# Patient Record
Sex: Male | Born: 1960 | Race: Black or African American | Hispanic: No | Marital: Single | Smoking: Current every day smoker
Health system: Southern US, Community
[De-identification: ages and names within clinical notes are randomized; demographics above are authoritative.]

## PROBLEM LIST (undated history)

## (undated) DIAGNOSIS — I2510 Atherosclerotic heart disease of native coronary artery without angina pectoris: Secondary | ICD-10-CM

## (undated) DIAGNOSIS — I10 Essential (primary) hypertension: Secondary | ICD-10-CM

## (undated) DIAGNOSIS — Z789 Other specified health status: Secondary | ICD-10-CM

## (undated) DIAGNOSIS — Z720 Tobacco use: Secondary | ICD-10-CM

## (undated) DIAGNOSIS — E118 Type 2 diabetes mellitus with unspecified complications: Secondary | ICD-10-CM

## (undated) DIAGNOSIS — I255 Ischemic cardiomyopathy: Secondary | ICD-10-CM

## (undated) DIAGNOSIS — F1410 Cocaine abuse, uncomplicated: Secondary | ICD-10-CM

## (undated) DIAGNOSIS — E785 Hyperlipidemia, unspecified: Secondary | ICD-10-CM

---
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
## 2003-05-29 ENCOUNTER — Encounter: Payer: Self-pay | Admitting: Emergency Medicine

## 2003-05-29 ENCOUNTER — Emergency Department (HOSPITAL_COMMUNITY): Admission: EM | Admit: 2003-05-29 | Discharge: 2003-05-30 | Payer: Self-pay | Admitting: Emergency Medicine

## 2003-06-05 ENCOUNTER — Emergency Department (HOSPITAL_COMMUNITY): Admission: EM | Admit: 2003-06-05 | Discharge: 2003-06-05 | Payer: Self-pay | Admitting: Emergency Medicine

## 2007-08-08 ENCOUNTER — Emergency Department (HOSPITAL_COMMUNITY): Admission: EM | Admit: 2007-08-08 | Discharge: 2007-08-08 | Payer: Self-pay | Admitting: Emergency Medicine

## 2007-08-19 ENCOUNTER — Encounter: Admission: RE | Admit: 2007-08-19 | Discharge: 2007-08-19 | Payer: Self-pay | Admitting: Emergency Medicine

## 2009-06-19 IMAGING — CR DG LUMBAR SPINE 2-3V
3 series · 3 of 3 positions shown · non-contrast
Comparison: none

CLINICAL DATA: Back pain.
 LUMBAR SPINE ? TWO VIEWS:
 The lumbar vertebrae are in normal alignment with normal intervertebral disk spaces.  No significant degenerative change is seen and no compression deformity is present.  The SI joints appear normal.

[view not recorded (1 of 3)]
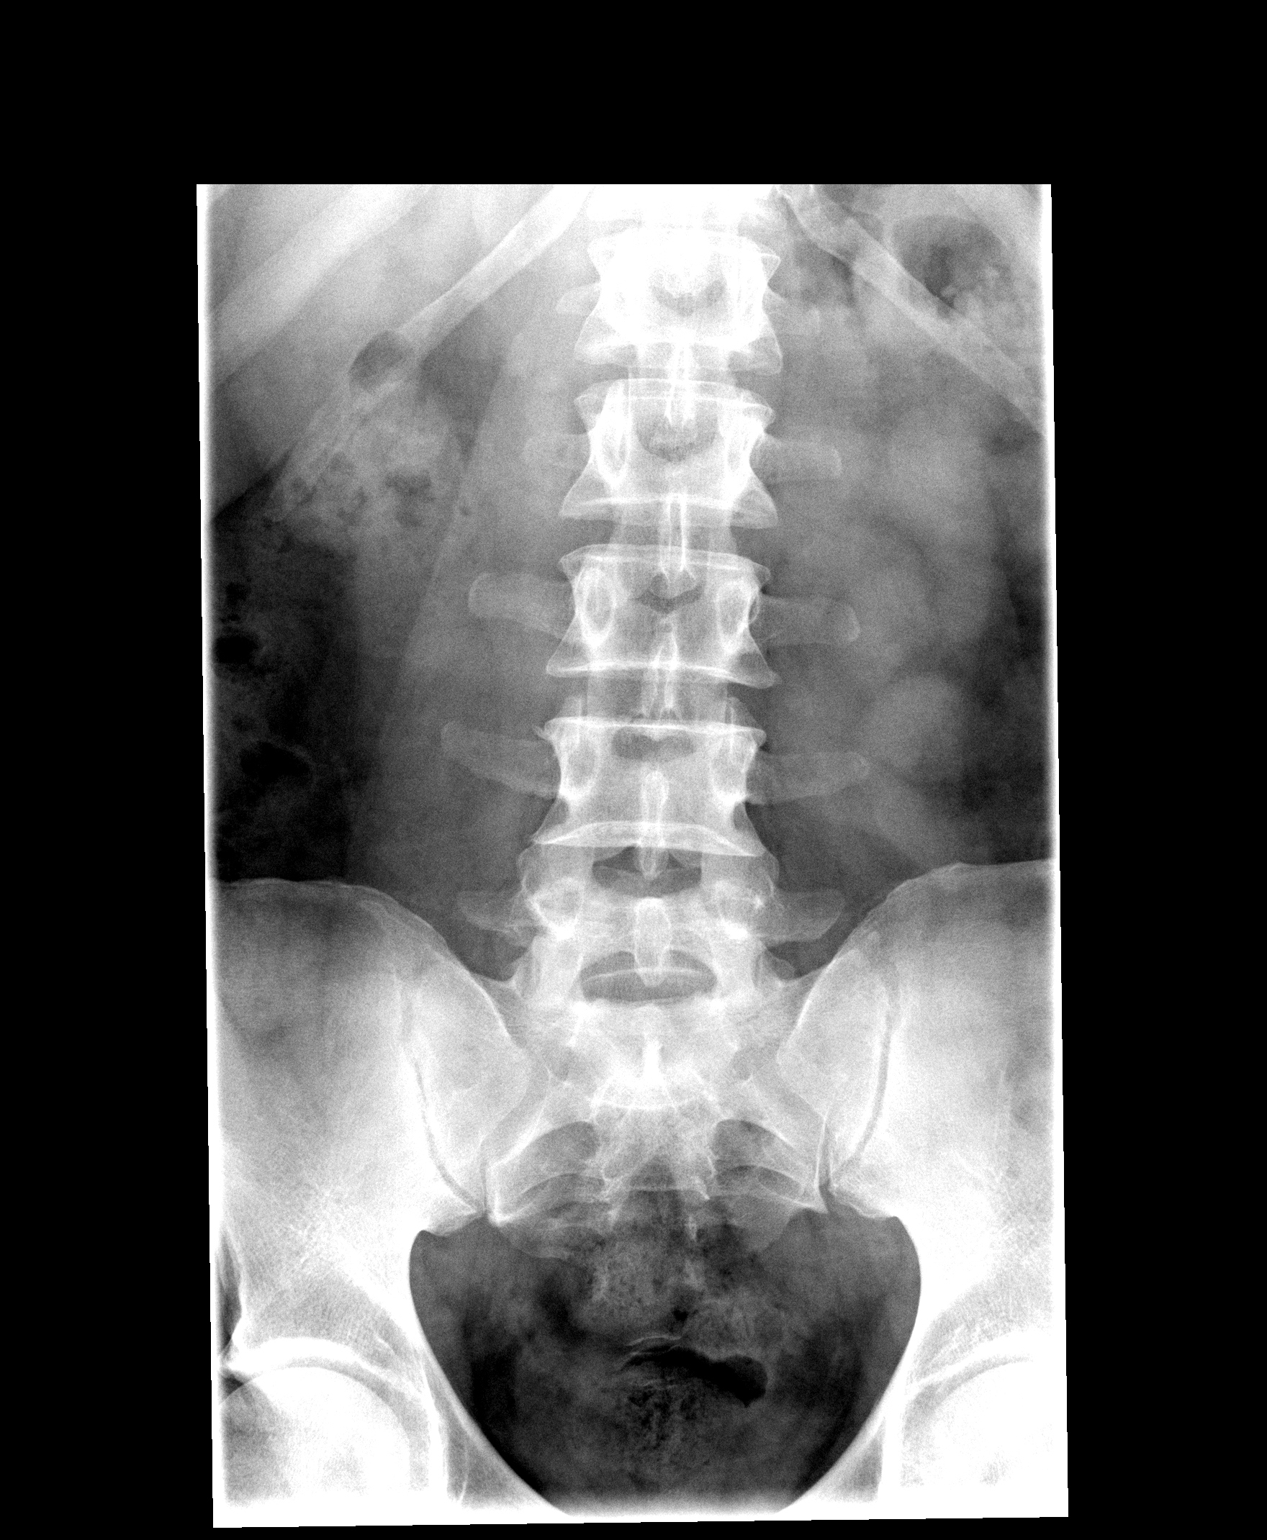

[view not recorded (2 of 3)]
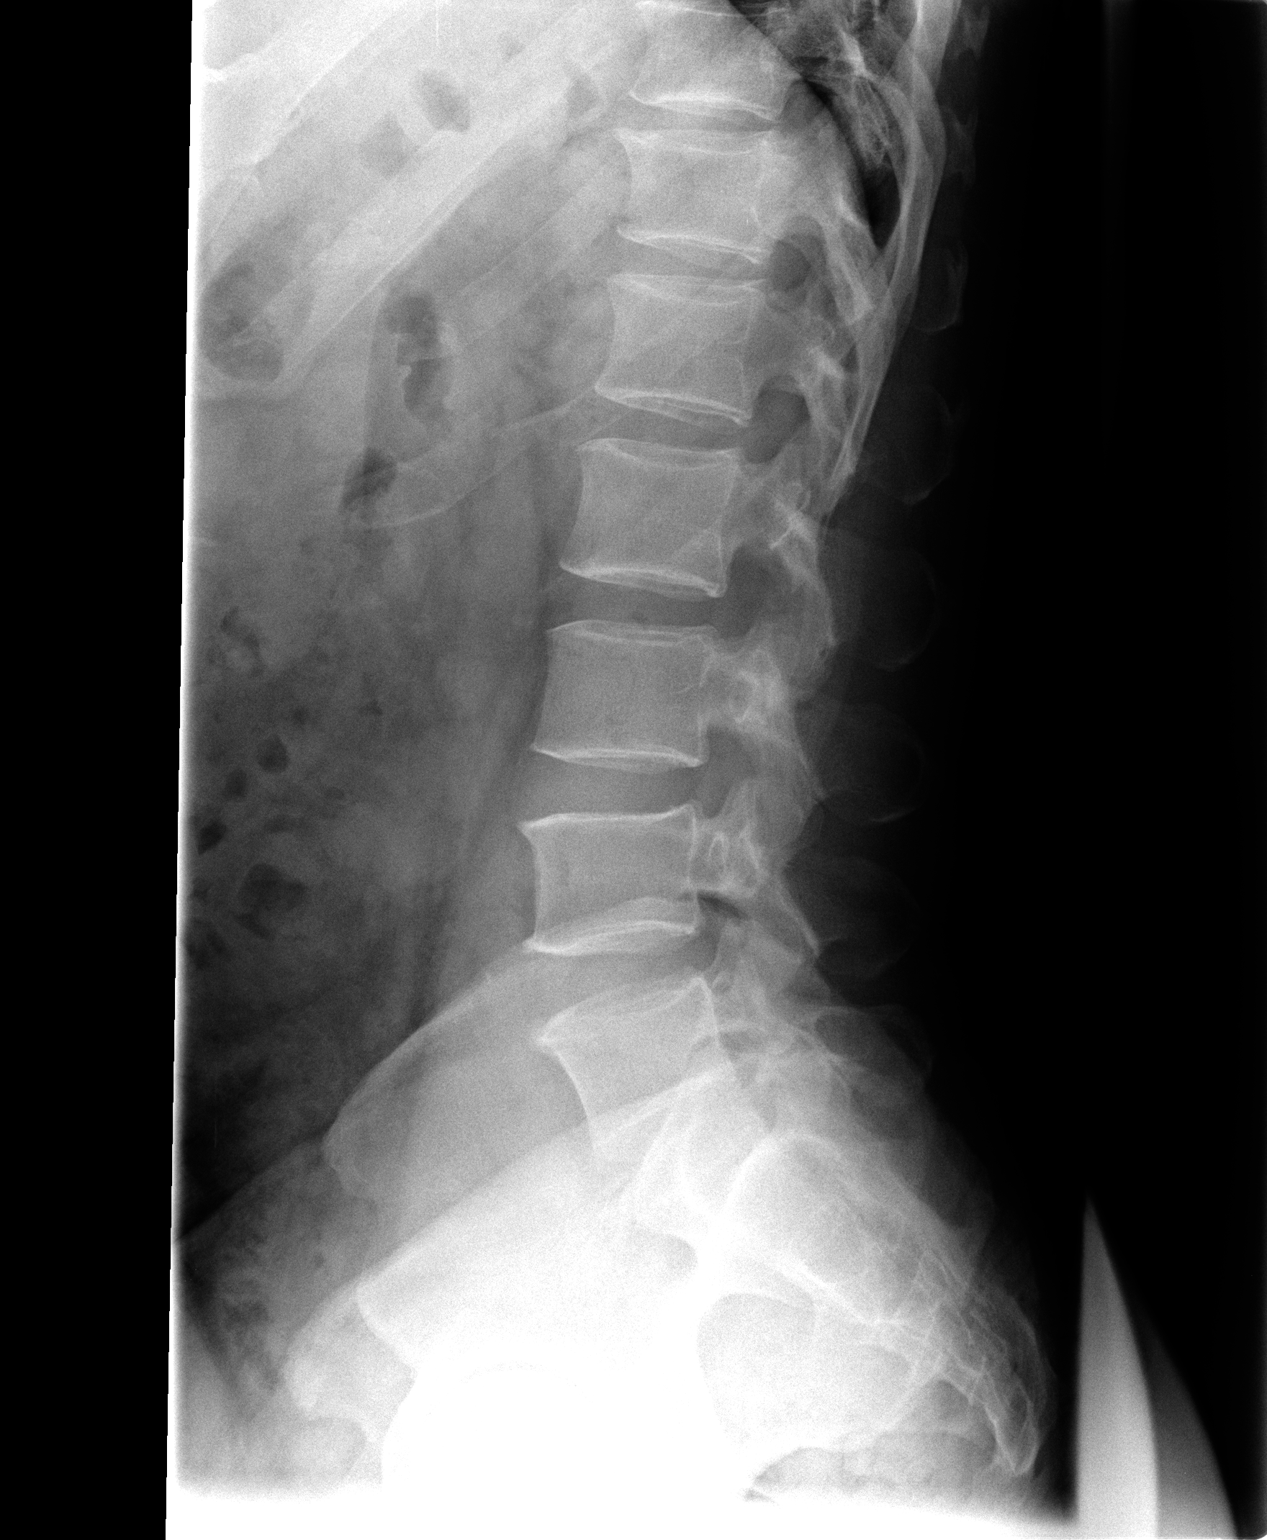

[view not recorded (3 of 3)]
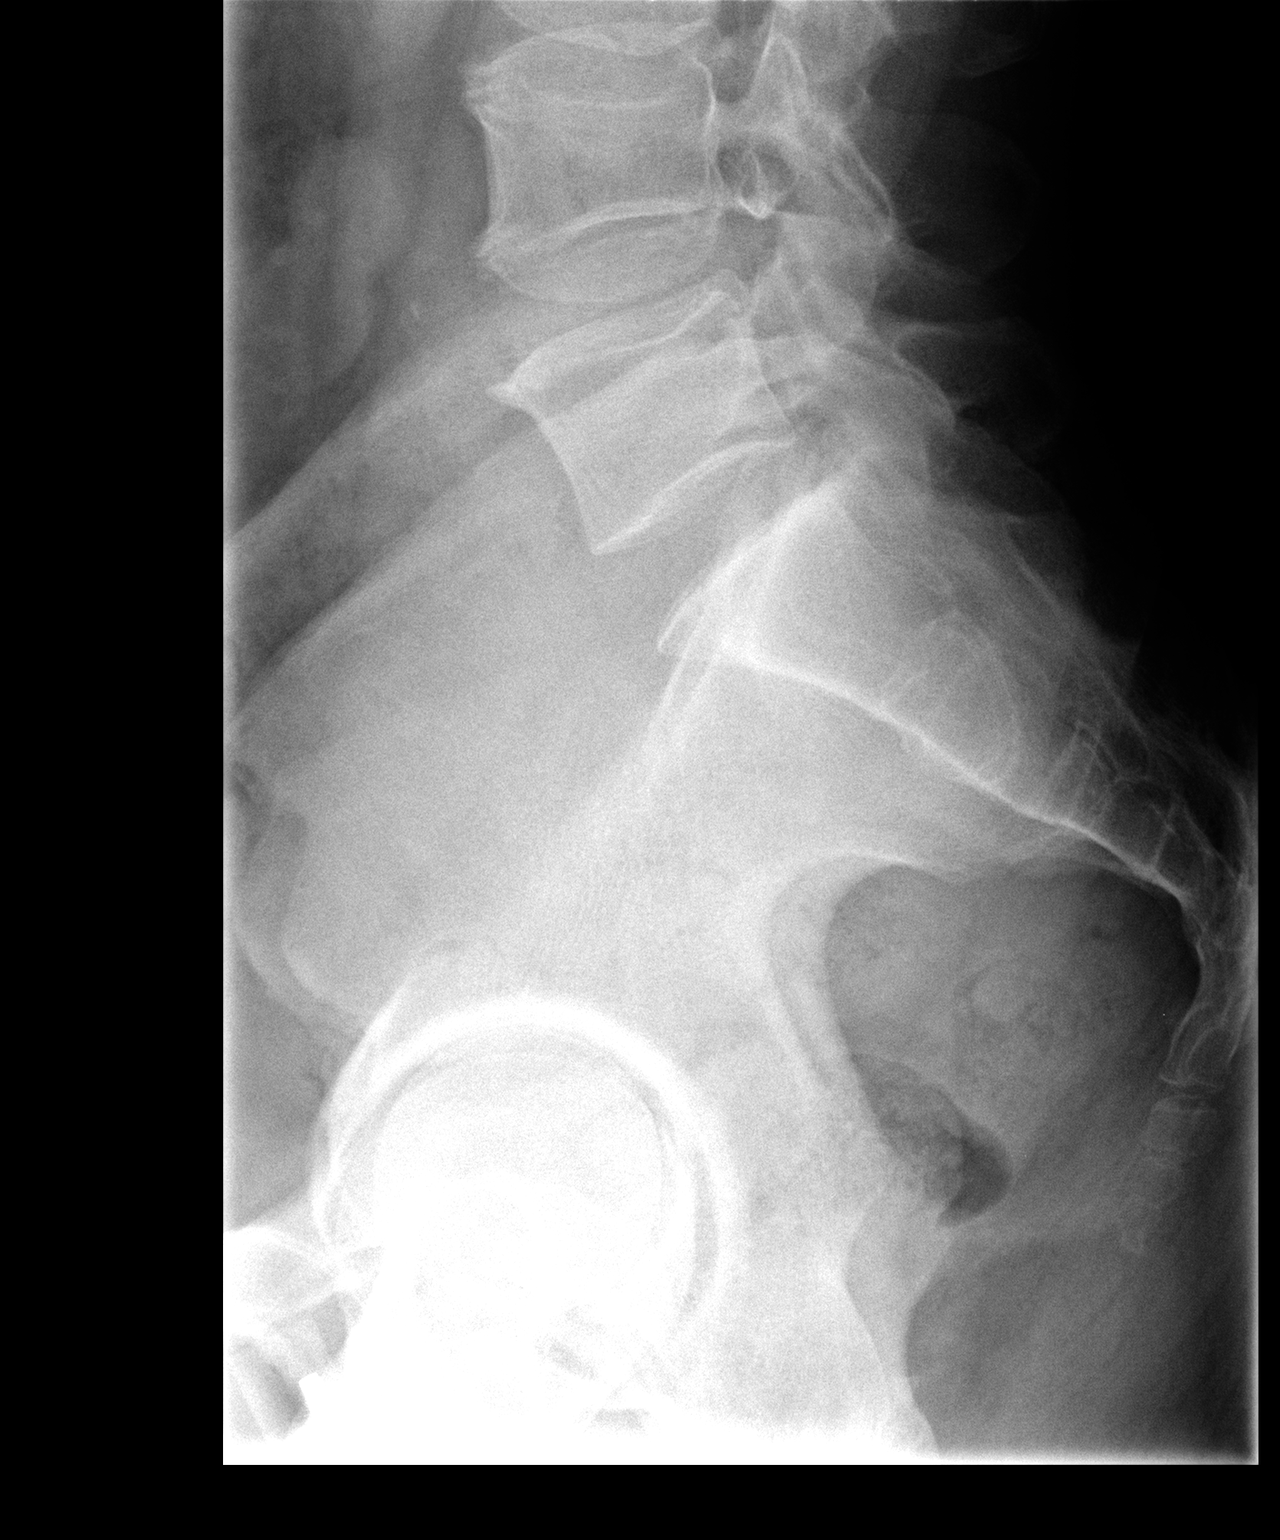

[3 of 3 positions shown; findings below may reference images not displayed]

IMPRESSION: Negative.

## 2011-09-06 LAB — COMPREHENSIVE METABOLIC PANEL
ALT: 22
AST: 20
Albumin: 3.9
Alkaline Phosphatase: 85
BUN: 12
CO2: 25
GFR calc Af Amer: >60
GFR calc non Af Amer: >60
Glucose, Bld: 125 — ABNORMAL HIGH

## 2011-09-06 LAB — URINALYSIS, ROUTINE W REFLEX MICROSCOPIC
Bilirubin Urine: NEGATIVE
Nitrite: NEGATIVE
Specific Gravity, Urine: 1.019
Urobilinogen, UA: 0.2

## 2011-09-06 LAB — CBC
HCT: 45.1
Hemoglobin: 15.9
MCV: 90.2

## 2011-09-06 LAB — DIFFERENTIAL
Basophils Absolute: 0
Lymphocytes Relative: 19
Neutro Abs: 9 — ABNORMAL HIGH
Neutrophils Relative %: 72

## 2011-09-06 LAB — LACTIC ACID, PLASMA: Lactic Acid, Venous: 1.7

## 2011-09-06 LAB — LIPASE, BLOOD: Lipase: 30

## 2013-05-13 ENCOUNTER — Emergency Department (INDEPENDENT_AMBULATORY_CARE_PROVIDER_SITE_OTHER)
Admission: EM | Admit: 2013-05-13 | Discharge: 2013-05-13 | Disposition: A | Discharge status: Home / Self Care | Payer: BC Managed Care – PPO | Source: Home / Self Care | Attending: Emergency Medicine | Admitting: Emergency Medicine

## 2013-05-13 ENCOUNTER — Encounter (HOSPITAL_COMMUNITY): Payer: Self-pay | Admitting: *Deleted

## 2013-05-13 DIAGNOSIS — S335XXA Sprain of ligaments of lumbar spine, initial encounter: Secondary | ICD-10-CM

## 2013-05-13 MED ORDER — IBUPROFEN 800 MG PO TABS
800.0000 mg | ORAL_TABLET | Freq: Once | ORAL | Status: AC
  Administered 2013-05-13: 800 mg via ORAL

## 2013-05-13 MED ORDER — DICLOFENAC SODIUM 50 MG PO TBEC: 50.0000 mg | DELAYED_RELEASE_TABLET | Freq: Two times a day (BID) | ORAL | Status: AC

## 2013-05-13 MED ORDER — CYCLOBENZAPRINE HCL 10 MG PO TABS: 10.0000 mg | ORAL_TABLET | Freq: Two times a day (BID) | ORAL | Status: DC | PRN

## 2013-05-13 MED ORDER — IBUPROFEN 800 MG PO TABS
ORAL_TABLET | ORAL | Status: AC
  Filled 2013-05-13: qty 1

## 2013-05-13 NOTE — ED Notes (Signed)
Woke up yesterday morning with low back pain - was moving tables on the day before. Pain mid low back  Only - now radiation.

## 2013-05-13 NOTE — ED Provider Notes (Addendum)
History     CSN: [id number]  Arrival date & time 05/13/13  1021   First MD Initiated Contact with Patient 05/13/13 1048      Chief Complaint  Patient presents with  . Back Pain    (Consider location/radiation/quality/duration/timing/severity/associated sxs/prior treatment) HPI Comments: Patient presents to urgent care describing that Tuesday he was lifting and moving furniture. The following day woke up with significant pain on his lower back. Movement and activity makes the pain worse. Patient denies any numbness or tingling sensation radiating or affecting his lower extremities or back. He denies any fecal or urinary incontinence or lower extremity weakness. Prior, to Wednesday patient had no pain at all. Patient describes she's been taking Tylenol over-the-counter for pain relief. Patient denies any further symptoms such as fevers, generalized malaise arthralgias myalgias- or abdominal pain  Patient is a 52 y.o. male presenting with back pain. The history is provided by the patient.  Back Pain Location:  Lumbar spine Quality:  Aching Radiates to:  Does not radiate Pain severity:  Moderate Pain is:  Same all the time Onset quality:  Gradual Duration:  3 days Timing:  Constant Chronicity:  New Context: lifting heavy objects   Context: not falling, not jumping from heights, not recent illness and not recent injury   Relieved by:  Bed rest and NSAIDs Worsened by:  Movement and standing Associated symptoms: no abdominal pain, no bladder incontinence, no bowel incontinence, no chest pain, no fever, no leg pain, no numbness, no paresthesias, no perianal numbness, no tingling and no weakness   Risk factors: lack of exercise   Risk factors: no hx of cancer and no recent surgery     History reviewed. No pertinent past medical history.  Past Surgical History  Procedure Laterality Date  . Artery repair Right 1970    laceration right elbow with artery involvement    History  reviewed. No pertinent family history.  History  Substance Use Topics  . Smoking status: Current Every Day Smoker -- 1.00 packs/day for 30 years    Types: Cigarettes  . Smokeless tobacco: Never Used  . Alcohol Use: 3.6 oz/week    6 Cans of beer per week     Comment: 6 pack per day      Review of Systems  Constitutional: Positive for activity change. Negative for fever, chills, diaphoresis, appetite change and fatigue.  HENT: Negative for neck pain.   Respiratory: Negative for shortness of breath.   Cardiovascular: Negative for chest pain.  Gastrointestinal: Negative for abdominal pain and bowel incontinence.  Genitourinary: Negative for bladder incontinence and urgency.  Musculoskeletal: Positive for back pain. Negative for myalgias, joint swelling, arthralgias and gait problem.  Skin: Negative for color change, pallor, rash and wound.  Neurological: Negative for tingling, weakness, numbness and paresthesias.    Allergies  Review of patient's allergies indicates no known allergies.  Home Medications   Current Outpatient Rx  Name  Route  Sig  Dispense  Refill  . cyclobenzaprine (FLEXERIL) 10 MG tablet   Oral   Take 1 tablet (10 mg total) by mouth 2 (two) times daily as needed for muscle spasms.   14 tablet   0   . diclofenac (VOLTAREN) 50 MG EC tablet   Oral   Take 1 tablet (50 mg total) by mouth 2 (two) times daily.   14 tablet   9     BP 134/82  Pulse 77  Temp(Src) 97.4 F (36.3 C) (Oral)  SpO2 93%  Physical Exam  Nursing note and vitals reviewed. Constitutional: He appears well-developed and well-nourished.  Eyes: Conjunctivae are normal.  Abdominal: There is no tenderness.  Musculoskeletal: He exhibits tenderness.       Back:  Neurological: He is alert. He exhibits normal muscle tone. Coordination normal.  Skin: No rash noted. No erythema.    ED Course  Procedures (including critical care time)  Labs Reviewed - No data to display No results  found.   1. Lumbar sprain, initial encounter       MDM  Symptoms and exam worse most consistent with a mid paravertebral lumbar sprain/strain. Patient will be treated with end-stage and a muscle relaxer for the next 5-7 days. Have discussed with patient and symptoms that should warrant further evaluation of his return or if pain persists or no improvement is noted after 7-10 days to followup with the orthopedic Dr. patient agreed with treatment plan and followup care as necessary.        Jimmie Molly, MD 05/13/13 1142  Jimmie Molly, MD 05/13/13 1147

## 2013-06-13 ENCOUNTER — Telehealth (HOSPITAL_COMMUNITY): Payer: Self-pay | Admitting: Emergency Medicine

## 2013-06-13 NOTE — ED Notes (Signed)
Chart reviewed.  Work note was given at time of visit to return on 05/15/2013.  Patient made aware we could reprint original note and place it at front desk for him.  Patient states he did not follow up with Ortho because he did not have co-pay.

## 2017-07-10 ENCOUNTER — Inpatient Hospital Stay (HOSPITAL_COMMUNITY)
Admission: EM | Admit: 2017-07-10 | Discharge: 2017-07-12 | DRG: 247 | Disposition: A | Discharge status: Home / Self Care | Payer: BC Managed Care – PPO | Attending: Cardiovascular Disease | Admitting: Cardiovascular Disease

## 2017-07-10 ENCOUNTER — Inpatient Hospital Stay (HOSPITAL_COMMUNITY): Payer: BC Managed Care – PPO

## 2017-07-10 ENCOUNTER — Encounter (HOSPITAL_COMMUNITY)
Admission: EM | Disposition: A | Discharge status: Home / Self Care | Payer: Self-pay | Source: Home / Self Care | Attending: Cardiovascular Disease

## 2017-07-10 ENCOUNTER — Encounter (HOSPITAL_COMMUNITY): Payer: Self-pay | Admitting: *Deleted

## 2017-07-10 ENCOUNTER — Emergency Department (HOSPITAL_COMMUNITY): Payer: BC Managed Care – PPO

## 2017-07-10 DIAGNOSIS — F17210 Nicotine dependence, cigarettes, uncomplicated: Secondary | ICD-10-CM | POA: Diagnosis present

## 2017-07-10 DIAGNOSIS — I10 Essential (primary) hypertension: Secondary | ICD-10-CM | POA: Diagnosis present

## 2017-07-10 DIAGNOSIS — I2102 ST elevation (STEMI) myocardial infarction involving left anterior descending coronary artery: Secondary | ICD-10-CM

## 2017-07-10 DIAGNOSIS — I2109 ST elevation (STEMI) myocardial infarction involving other coronary artery of anterior wall: Secondary | ICD-10-CM | POA: Diagnosis present

## 2017-07-10 DIAGNOSIS — Z9861 Coronary angioplasty status: Secondary | ICD-10-CM

## 2017-07-10 DIAGNOSIS — I2121 ST elevation (STEMI) myocardial infarction involving left circumflex coronary artery: Secondary | ICD-10-CM

## 2017-07-10 DIAGNOSIS — E118 Type 2 diabetes mellitus with unspecified complications: Secondary | ICD-10-CM

## 2017-07-10 DIAGNOSIS — F1410 Cocaine abuse, uncomplicated: Secondary | ICD-10-CM

## 2017-07-10 DIAGNOSIS — E785 Hyperlipidemia, unspecified: Secondary | ICD-10-CM | POA: Diagnosis present

## 2017-07-10 DIAGNOSIS — I255 Ischemic cardiomyopathy: Secondary | ICD-10-CM

## 2017-07-10 DIAGNOSIS — I252 Old myocardial infarction: Secondary | ICD-10-CM | POA: Diagnosis present

## 2017-07-10 DIAGNOSIS — I2510 Atherosclerotic heart disease of native coronary artery without angina pectoris: Secondary | ICD-10-CM | POA: Diagnosis present

## 2017-07-10 DIAGNOSIS — I213 ST elevation (STEMI) myocardial infarction of unspecified site: Secondary | ICD-10-CM | POA: Diagnosis not present

## 2017-07-10 DIAGNOSIS — E876 Hypokalemia: Secondary | ICD-10-CM | POA: Diagnosis present

## 2017-07-10 DIAGNOSIS — Z720 Tobacco use: Secondary | ICD-10-CM

## 2017-07-10 DIAGNOSIS — Z955 Presence of coronary angioplasty implant and graft: Secondary | ICD-10-CM

## 2017-07-10 HISTORY — PX: CORONARY/GRAFT ACUTE MI REVASCULARIZATION: CATH118305

## 2017-07-10 HISTORY — DX: Type 2 diabetes mellitus with unspecified complications: E11.8

## 2017-07-10 HISTORY — DX: Ischemic cardiomyopathy: I25.5

## 2017-07-10 HISTORY — DX: Other specified health status: Z78.9

## 2017-07-10 HISTORY — DX: Tobacco use: Z72.0

## 2017-07-10 HISTORY — DX: Essential (primary) hypertension: I10

## 2017-07-10 HISTORY — PX: LEFT HEART CATH AND CORONARY ANGIOGRAPHY: CATH118249

## 2017-07-10 HISTORY — DX: Cocaine abuse, uncomplicated: F14.10

## 2017-07-10 HISTORY — DX: Hyperlipidemia, unspecified: E78.5

## 2017-07-10 HISTORY — DX: Atherosclerotic heart disease of native coronary artery without angina pectoris: I25.10

## 2017-07-10 LAB — CBC WITH DIFFERENTIAL/PLATELET
Basophils Absolute: 0.1 10*3/uL (ref 0.0–0.1)
Basophils Relative: 1 %
Eosinophils Absolute: 0.4 10*3/uL (ref 0.0–0.7)
Eosinophils Relative: 3 %
HCT: 41.2 % (ref 39.0–52.0)
Hemoglobin: 15.1 g/dL (ref 13.0–17.0)
Lymphocytes Relative: 34 %
Lymphs Abs: 4.6 10*3/uL — ABNORMAL HIGH (ref 0.7–4.0)
MCH: 31 pg (ref 26.0–34.0)
MCHC: 36.7 g/dL — ABNORMAL HIGH (ref 30.0–36.0)
MCV: 84.6 fL (ref 78.0–100.0)
Monocytes Absolute: 0.7 10*3/uL (ref 0.1–1.0)
Monocytes Relative: 6 %
Neutro Abs: 7.7 10*3/uL (ref 1.7–7.7)
Neutrophils Relative %: 57 %
Platelets: 362 10*3/uL (ref 150–400)
RBC: 4.87 MIL/uL (ref 4.22–5.81)
RDW: 12.5 % (ref 11.5–15.5)
WBC: 13.5 10*3/uL — ABNORMAL HIGH (ref 4.0–10.5)

## 2017-07-10 LAB — GLUCOSE, CAPILLARY
GLUCOSE-CAPILLARY: 189 mg/dL — AB (ref 65–99)
GLUCOSE-CAPILLARY: 189 mg/dL — AB (ref 65–99)
Glucose-Capillary: 199 mg/dL — ABNORMAL HIGH (ref 65–99)

## 2017-07-10 LAB — HEMOGLOBIN A1C
HEMOGLOBIN A1C: 9.5 % — AB (ref 4.8–5.6)
MEAN PLASMA GLUCOSE: 225.95 mg/dL

## 2017-07-10 LAB — TROPONIN I
TROPONIN I: 19.06 ng/mL — AB (ref <0.03)
Troponin I: 0.13 ng/mL
Troponin I: 5.4 ng/mL (ref <0.03)
Troponin I: 8.47 ng/mL (ref <0.03)

## 2017-07-10 LAB — ECHOCARDIOGRAM COMPLETE
HEIGHTINCHES: 70 in
WEIGHTICAEL: 3418.01 [oz_av]

## 2017-07-10 LAB — COMPREHENSIVE METABOLIC PANEL WITH GFR
ALT: 26 U/L (ref 17–63)
AST: 30 U/L (ref 15–41)
Albumin: 3.9 g/dL (ref 3.5–5.0)
Alkaline Phosphatase: 74 U/L (ref 38–126)
Anion gap: 13 (ref 5–15)
BUN: 10 mg/dL (ref 6–20)
CO2: 19 mmol/L — ABNORMAL LOW (ref 22–32)
Calcium: 8.9 mg/dL (ref 8.9–10.3)
Chloride: 103 mmol/L (ref 101–111)
Creatinine, Ser: 1.15 mg/dL (ref 0.61–1.24)
GFR calc Af Amer: >60 mL/min
GFR calc non Af Amer: >60 mL/min
Glucose, Bld: 329 mg/dL — ABNORMAL HIGH (ref 65–99)
Potassium: 3.6 mmol/L (ref 3.5–5.1)
Sodium: 135 mmol/L (ref 135–145)
Total Bilirubin: 0.9 mg/dL (ref 0.3–1.2)
Total Protein: 6.5 g/dL (ref 6.5–8.1)

## 2017-07-10 LAB — RAPID URINE DRUG SCREEN, HOSP PERFORMED
AMPHETAMINES: NOT DETECTED
BENZODIAZEPINES: NOT DETECTED
Barbiturates: NOT DETECTED
COCAINE: POSITIVE — AB
OPIATES: NOT DETECTED
Tetrahydrocannabinol: NOT DETECTED

## 2017-07-10 LAB — TSH: TSH: 0.526 u[IU]/mL (ref 0.350–4.500)

## 2017-07-10 LAB — LIPID PANEL
Cholesterol: 214 mg/dL — ABNORMAL HIGH (ref 0–200)
HDL: 31 mg/dL — ABNORMAL LOW (ref >40)
LDL CALC: 123 mg/dL — AB (ref 0–99)
Total CHOL/HDL Ratio: 6.9 RATIO
Triglycerides: 302 mg/dL — ABNORMAL HIGH (ref <150)
VLDL: 60 mg/dL — ABNORMAL HIGH (ref 0–40)

## 2017-07-10 LAB — HIV ANTIBODY (ROUTINE TESTING W REFLEX): HIV Screen 4th Generation wRfx: NONREACTIVE

## 2017-07-10 LAB — POCT ACTIVATED CLOTTING TIME: ACTIVATED CLOTTING TIME: 357 s

## 2017-07-10 LAB — PROTIME-INR
INR: 0.95
Prothrombin Time: 12.6 seconds (ref 11.4–15.2)

## 2017-07-10 LAB — MRSA PCR SCREENING: MRSA by PCR: NEGATIVE

## 2017-07-10 LAB — APTT: aPTT: 27 s (ref 24–36)

## 2017-07-10 SURGERY — LEFT HEART CATH AND CORONARY ANGIOGRAPHY
Anesthesia: LOCAL

## 2017-07-10 MED ORDER — LABETALOL HCL 5 MG/ML IV SOLN
10.0000 mg | INTRAVENOUS | Status: AC | PRN
  Administered 2017-07-10: 10 mg via INTRAVENOUS
  Filled 2017-07-10: qty 4

## 2017-07-10 MED ORDER — SODIUM CHLORIDE 0.9 % IV SOLN
INTRAVENOUS | Status: DC
  Administered 2017-07-10: 10 mL/h via INTRAVENOUS

## 2017-07-10 MED ORDER — LIVING WELL WITH DIABETES BOOK
Freq: Once | Status: AC
  Administered 2017-07-10: 12:00:00
  Filled 2017-07-10: qty 1

## 2017-07-10 MED ORDER — VERAPAMIL HCL 2.5 MG/ML IV SOLN
INTRA_ARTERIAL | Status: DC | PRN
  Administered 2017-07-10: 7.5 mL via INTRA_ARTERIAL

## 2017-07-10 MED ORDER — TICAGRELOR 90 MG PO TABS
ORAL_TABLET | ORAL | Status: DC | PRN
  Administered 2017-07-10: 180 mg via ORAL

## 2017-07-10 MED ORDER — SODIUM CHLORIDE 0.9% FLUSH
3.0000 mL | Freq: Two times a day (BID) | INTRAVENOUS | Status: DC
  Administered 2017-07-10: 6 mL via INTRAVENOUS
  Administered 2017-07-10 – 2017-07-11 (×3): 3 mL via INTRAVENOUS

## 2017-07-10 MED ORDER — SODIUM CHLORIDE 0.9 % IV SOLN: 250.0000 mL | INTRAVENOUS | Status: DC | PRN

## 2017-07-10 MED ORDER — NITROGLYCERIN 0.4 MG SL SUBL: 0.4000 mg | SUBLINGUAL_TABLET | SUBLINGUAL | Status: DC | PRN

## 2017-07-10 MED ORDER — BIVALIRUDIN TRIFLUOROACETATE 250 MG IV SOLR
INTRAVENOUS | Status: AC
  Filled 2017-07-10: qty 250

## 2017-07-10 MED ORDER — SODIUM CHLORIDE 0.9 % IV SOLN: 1.7500 mg/kg/h | INTRAVENOUS | Status: DC

## 2017-07-10 MED ORDER — TICAGRELOR 90 MG PO TABS
90.0000 mg | ORAL_TABLET | Freq: Two times a day (BID) | ORAL | Status: DC
  Administered 2017-07-10 – 2017-07-12 (×5): 90 mg via ORAL
  Filled 2017-07-10 (×5): qty 1

## 2017-07-10 MED ORDER — HYDRALAZINE HCL 20 MG/ML IJ SOLN
5.0000 mg | INTRAMUSCULAR | Status: AC | PRN
  Administered 2017-07-10: 5 mg via INTRAVENOUS
  Filled 2017-07-10: qty 1

## 2017-07-10 MED ORDER — LIDOCAINE HCL (PF) 1 % IJ SOLN
INTRAMUSCULAR | Status: DC | PRN
  Administered 2017-07-10: 2 mL via INTRADERMAL

## 2017-07-10 MED ORDER — IOPAMIDOL (ISOVUE-370) INJECTION 76%
INTRAVENOUS | Status: DC | PRN
  Administered 2017-07-10: 180 mL via INTRA_ARTERIAL

## 2017-07-10 MED ORDER — LISINOPRIL 5 MG PO TABS
5.0000 mg | ORAL_TABLET | Freq: Every day | ORAL | Status: DC
  Administered 2017-07-10 – 2017-07-12 (×3): 5 mg via ORAL
  Filled 2017-07-10 (×3): qty 1

## 2017-07-10 MED ORDER — ONDANSETRON HCL 4 MG/2ML IJ SOLN: 4.0000 mg | Freq: Four times a day (QID) | INTRAMUSCULAR | Status: DC | PRN

## 2017-07-10 MED ORDER — IOPAMIDOL (ISOVUE-370) INJECTION 76%
INTRAVENOUS | Status: AC
  Filled 2017-07-10: qty 100

## 2017-07-10 MED ORDER — SODIUM CHLORIDE 0.9 % IV SOLN
1.7500 mg/kg/h | INTRAVENOUS | Status: DC
  Administered 2017-07-10 (×4): 1.75 mg/kg/h via INTRAVENOUS
  Filled 2017-07-10 (×3): qty 250

## 2017-07-10 MED ORDER — SODIUM CHLORIDE 0.9 % IV SOLN: INTRAVENOUS | Status: AC

## 2017-07-10 MED ORDER — LIDOCAINE HCL (PF) 1 % IJ SOLN
INTRAMUSCULAR | Status: AC
  Filled 2017-07-10: qty 30

## 2017-07-10 MED ORDER — ACETAMINOPHEN 325 MG PO TABS: 650.0000 mg | ORAL_TABLET | ORAL | Status: DC | PRN

## 2017-07-10 MED ORDER — ASPIRIN EC 81 MG PO TBEC
81.0000 mg | DELAYED_RELEASE_TABLET | Freq: Every day | ORAL | Status: DC
  Administered 2017-07-11 – 2017-07-12 (×2): 81 mg via ORAL
  Filled 2017-07-10 (×2): qty 1

## 2017-07-10 MED ORDER — SODIUM CHLORIDE 0.9 % IV SOLN
INTRAVENOUS | Status: AC | PRN
  Administered 2017-07-10: 1.75 mg/kg/h via INTRAVENOUS

## 2017-07-10 MED ORDER — NITROGLYCERIN 0.4 MG SL SUBL
0.4000 mg | SUBLINGUAL_TABLET | SUBLINGUAL | Status: DC | PRN
  Administered 2017-07-10: 0.4 mg via SUBLINGUAL
  Filled 2017-07-10: qty 1

## 2017-07-10 MED ORDER — ATORVASTATIN CALCIUM 80 MG PO TABS
80.0000 mg | ORAL_TABLET | Freq: Every day | ORAL | Status: DC
  Administered 2017-07-10 – 2017-07-11 (×2): 80 mg via ORAL
  Filled 2017-07-10 (×2): qty 1

## 2017-07-10 MED ORDER — ATORVASTATIN CALCIUM 80 MG PO TABS: 80.0000 mg | ORAL_TABLET | Freq: Every day | ORAL | Status: DC

## 2017-07-10 MED ORDER — PNEUMOCOCCAL VAC POLYVALENT 25 MCG/0.5ML IJ INJ
0.5000 mL | INJECTION | INTRAMUSCULAR | Status: AC
  Administered 2017-07-11: 0.5 mL via INTRAMUSCULAR
  Filled 2017-07-10: qty 0.5

## 2017-07-10 MED ORDER — SODIUM CHLORIDE 0.9% FLUSH: 3.0000 mL | INTRAVENOUS | Status: DC | PRN

## 2017-07-10 MED ORDER — INSULIN ASPART 100 UNIT/ML ~~LOC~~ SOLN: 0.0000 [IU] | Freq: Every day | SUBCUTANEOUS | Status: DC

## 2017-07-10 MED ORDER — TICAGRELOR 90 MG PO TABS
ORAL_TABLET | ORAL | Status: AC
  Filled 2017-07-10: qty 2

## 2017-07-10 MED ORDER — INSULIN ASPART 100 UNIT/ML ~~LOC~~ SOLN
0.0000 [IU] | Freq: Three times a day (TID) | SUBCUTANEOUS | Status: DC
  Administered 2017-07-10 (×2): 3 [IU] via SUBCUTANEOUS
  Administered 2017-07-11: 8 [IU] via SUBCUTANEOUS
  Administered 2017-07-11: 3 [IU] via SUBCUTANEOUS
  Administered 2017-07-11: 2 [IU] via SUBCUTANEOUS
  Administered 2017-07-12: 3 [IU] via SUBCUTANEOUS
  Administered 2017-07-12: 5 [IU] via SUBCUTANEOUS

## 2017-07-10 MED ORDER — ASPIRIN 81 MG PO CHEW
324.0000 mg | CHEWABLE_TABLET | Freq: Once | ORAL | Status: AC
  Administered 2017-07-10: 324 mg via ORAL
  Filled 2017-07-10: qty 4

## 2017-07-10 MED ORDER — CARVEDILOL 6.25 MG PO TABS
6.2500 mg | ORAL_TABLET | Freq: Two times a day (BID) | ORAL | Status: DC
  Administered 2017-07-10 – 2017-07-12 (×5): 6.25 mg via ORAL
  Filled 2017-07-10 (×4): qty 1
  Filled 2017-07-10: qty 2

## 2017-07-10 MED ORDER — NITROGLYCERIN 1 MG/10 ML FOR IR/CATH LAB
INTRA_ARTERIAL | Status: AC
  Filled 2017-07-10: qty 10

## 2017-07-10 MED ORDER — HEPARIN SODIUM (PORCINE) 5000 UNIT/ML IJ SOLN
4000.0000 [IU] | Freq: Once | INTRAMUSCULAR | Status: AC
  Administered 2017-07-10: 4000 [IU] via INTRAVENOUS

## 2017-07-10 MED ORDER — ASPIRIN 81 MG PO CHEW: 81.0000 mg | CHEWABLE_TABLET | Freq: Every day | ORAL | Status: DC

## 2017-07-10 MED ORDER — BIVALIRUDIN BOLUS VIA INFUSION - CUPID
INTRAVENOUS | Status: DC | PRN
  Administered 2017-07-10: 74.85 mg via INTRAVENOUS

## 2017-07-10 MED ORDER — VERAPAMIL HCL 2.5 MG/ML IV SOLN
INTRAVENOUS | Status: AC
  Filled 2017-07-10: qty 2

## 2017-07-10 MED ORDER — HEPARIN (PORCINE) IN NACL 2-0.9 UNIT/ML-% IJ SOLN
INTRAMUSCULAR | Status: AC
  Filled 2017-07-10: qty 1000

## 2017-07-10 MED ORDER — HEPARIN (PORCINE) IN NACL 2-0.9 UNIT/ML-% IJ SOLN
INTRAMUSCULAR | Status: AC | PRN
  Administered 2017-07-10: 1000 mL

## 2017-07-10 SURGICAL SUPPLY — 21 items
BALLN SAPPHIRE 2.0X12 (BALLOONS) ×2
BALLN SAPPHIRE ~~LOC~~ 2.75X12 (BALLOONS) ×2 IMPLANT
BALLOON SAPPHIRE 2.0X12 (BALLOONS) ×1 IMPLANT
CATH INFINITI 5FR ANG PIGTAIL (CATHETERS) ×2 IMPLANT
CATH OPTITORQUE TIG 4.5 5F (CATHETERS) ×2 IMPLANT
CATH VISTA GUIDE 6FR XBLAD3.5 (CATHETERS) ×2 IMPLANT
DEVICE RAD COMP TR BAND LRG (VASCULAR PRODUCTS) ×2 IMPLANT
GLIDESHEATH SLEND A-KIT 6F 22G (SHEATH) ×2 IMPLANT
GUIDEWIRE INQWIRE 1.5J.035X260 (WIRE) ×1 IMPLANT
INQWIRE 1.5J .035X260CM (WIRE) ×2
KIT ENCORE 26 ADVANTAGE (KITS) ×2 IMPLANT
KIT HEART LEFT (KITS) ×2 IMPLANT
PACK CARDIAC CATHETERIZATION (CUSTOM PROCEDURE TRAY) ×2 IMPLANT
SHEATH PINNACLE 6F 10CM (SHEATH) IMPLANT
STENT SYNERGY DES 2.25X16 (Permanent Stent) ×2 IMPLANT
SYR MEDRAD MARK V 150ML (SYRINGE) ×2 IMPLANT
TRANSDUCER W/STOPCOCK (MISCELLANEOUS) ×2 IMPLANT
TUBING CIL FLEX 10 FLL-RA (TUBING) ×2 IMPLANT
WIRE ASAHI PROWATER 180CM (WIRE) ×2 IMPLANT
WIRE EMERALD 3MM-J .035X150CM (WIRE) IMPLANT
WIRE HI TORQ VERSACORE-J 145CM (WIRE) ×2 IMPLANT

## 2017-07-10 NOTE — ED Notes (Signed)
Pt. ready to go to cath lab , waiting call from cath lab , NS IV infusing , IV sites intact , EDP explained plan of care to pt.

## 2017-07-10 NOTE — Progress Notes (Signed)
TR band removed at 1600. Site is clean and dry, level 0. Right radial pulse +2. Gauze dressing applied.  Will continue to monitor.

## 2017-07-10 NOTE — ED Notes (Signed)
Pt  Transported to cath lab 

## 2017-07-10 NOTE — ED Provider Notes (Signed)
[facility]-EMERGENCY DEPT Provider Note   CSN: [id number] Arrival date & time: 07/10/17  0348     History   Chief Complaint Chief Complaint  Patient presents with  . Chest Pain   LEVEL 5 CAVEAT DUE TO ACUITY OF CONDITION HPI Charles Vance is a 56 y.o. male.  The history is provided by the patient. The history is limited by the condition of the patient.  Chest Pain   This is a new problem. The current episode started 6 to 12 hours ago. The problem occurs constantly. The problem has been gradually improving. The pain is present in the substernal region. The pain is severe. The quality of the pain is described as pressure-like. The pain radiates to the left arm. Associated symptoms include diaphoresis. Pertinent negatives include no shortness of breath. He has tried nothing for the symptoms. Risk factors include male gender, smoking/tobacco exposure and substance abuse.     PMH - none Soc hx - smoker, social cocaine use (last use 20:00 ON 8/15) fam hx negative for CAD Past Surgical History:  Procedure Laterality Date  . ARTERY REPAIR Right 1970   laceration right elbow with artery involvement       Home Medications    Prior to Admission medications   Medication Sig Start Date End Date Taking? Authorizing Provider  cyclobenzaprine (FLEXERIL) 10 MG tablet Take 1 tablet (10 mg total) by mouth 2 (two) times daily as needed for muscle spasms. 05/13/13   Jimmie Mollyoll, Paolo, MD    Family History No family history on file.  Social History Social History  Substance Use Topics  . Smoking status: Current Every Day Smoker    Packs/day: 1.00    Years: 30.00    Types: Cigarettes  . Smokeless tobacco: Never Used  . Alcohol use 3.6 oz/week    6 Cans of beer per week     Comment: 6 pack per day     Allergies   Patient has no allergy information on record.   Review of Systems Review of Systems  Unable to perform ROS: Acuity of condition  Constitutional: Positive for diaphoresis.    Respiratory: Negative for shortness of breath.   Cardiovascular: Positive for chest pain.     Physical Exam Updated Vital Signs BP (!) 181/119 (BP Location: Right Arm)   Pulse (!) 110   Temp 97.7 F (36.5 C) (Oral)   Resp (!) 27   Ht 1.778 m (5\' 10" )   Wt 99.8 kg (220 lb)   SpO2 100%   BMI 31.57 kg/m   Physical Exam CONSTITUTIONAL: Well developed/well nourished, anxious HEAD: Normocephalic/atraumatic EYES: EOMI ENMT: Mucous membranes moist NECK: supple no meningeal signs SPINE/BACK:no cervical spine tenderness CV: S1/S2 noted, no murmurs/rubs/gallops noted LUNGS: Lungs are clear to auscultation bilaterally, no apparent distress ABDOMEN: soft, nontender NEURO: Pt is awake/alert/appropriate, moves all extremitiesx4.  No facial droop.   EXTREMITIES: pulses normal/equalx4, full ROM SKIN: warm, color normal PSYCH: anxious  ED Treatments / Results  Labs (all labs ordered are listed, but only abnormal results are displayed) Labs Reviewed  CBC WITH DIFFERENTIAL/PLATELET - Abnormal; Notable for the following:       Result Value   WBC 13.5 (*)    MCHC 36.7 (*)    Lymphs Abs 4.6 (*)    All other components within normal limits  PROTIME-INR  APTT  COMPREHENSIVE METABOLIC PANEL  TROPONIN I  LIPID PANEL  RAPID URINE DRUG SCREEN, HOSP PERFORMED    EKG  EKG Interpretation  Date/Time:  Thursday July 10 2017 03:56:17 EDT Ventricular Rate:  92 PR Interval:  198 QRS Duration: 90 QT Interval:  336 QTC Calculation: 415 R Axis:   81 Text Interpretation:  ** Critical Test Result: STEMI Normal sinus rhythm ST elevation consider lateral injury or acute infarct ** ** ACUTE MI / STEMI ** ** Abnormal ECG Confirmed by Zadie Rhine ([id number]) on 07/10/2017 4:07:00 AM       Radiology Dg Chest Port 1 View  Result Date: 07/10/2017 CLINICAL DATA:  Code STEMI, chest pain tonight. EXAM: PORTABLE CHEST 1 VIEW COMPARISON:  None. FINDINGS: Cardiac silhouette is upper limits of normal  in size. Mediastinal silhouette is nonsuspicious. Mildly calcified aortic knob. Pulmonary vascular congestion and mild interstitial prominence. Bandlike density LEFT lung base. LEFT costophrenic angle incompletely imaged. No visible pleural effusion. No pneumothorax. Soft tissue planes and included osseous structures are nonsuspicious. IMPRESSION: Borderline cardiomegaly. Pulmonary vascular congestion and interstitial prominence consistent with pulmonary edema. LEFT lung base atelectasis/scarring. Aortic Atherosclerosis (ICD10-I70.0). Electronically Signed   By: Awilda Metro M.D.   On: 07/10/2017 04:31    Procedures Procedures  CRITICAL CARE Performed by: Joya Gaskins Total critical care time: 33 minutes Critical care time was exclusive of separately billable procedures and treating other patients. Critical care was necessary to treat or prevent imminent or life-threatening deterioration. Critical care was time spent personally by me on the following activities: development of treatment plan with patient and/or surrogate as well as nursing, discussions with consultants, evaluation of patient's response to treatment, examination of patient, obtaining history from patient or surrogate, ordering and performing treatments and interventions, ordering and review of laboratory studies, ordering and review of radiographic studies, pulse oximetry and re-evaluation of patient's condition. PATIENT WITH ACUTE MI THAT REQUIRED CARDIAC CATH  Medications Ordered in ED Medications  0.9 %  sodium chloride infusion (10 mL/hr Intravenous New Bag/Given 07/10/17 0416)  nitroGLYCERIN (NITROSTAT) SL tablet 0.4 mg ( Sublingual MAR Hold 07/10/17 0439)  aspirin chewable tablet 324 mg (324 mg Oral Given 07/10/17 0414)  heparin injection 4,000 Units (4,000 Units Intravenous Given 07/10/17 0413)     Initial Impression / Assessment and Plan / ED Course  I have reviewed the triage vital signs and the nursing  notes.  Pertinent labs & imaging results that were available during my care of the patient were reviewed by me and considered in my medical decision making (see chart for details).     4:18 AM Seen on arrival to room for STEMI and code stemi called I spoke to dr berry by phone, he reviewed EKG  Cardiology fellow at bedside   PRIOR TO TRANSFER TO CATH LAB PT REPORTED CP WAS IMPROVED  Final Clinical Impressions(s) / ED Diagnoses   Final diagnoses:  ST elevation myocardial infarction involving left circumflex coronary artery [facility])    New Prescriptions New Prescriptions   No medications on file     Zadie Rhine, MD 07/10/17 [phone]

## 2017-07-10 NOTE — Progress Notes (Signed)
  Echocardiogram 2D Echocardiogram has been performed.  Arvil ChacoFoster, Brigida Scotti 07/10/2017, 3:26 PM

## 2017-07-10 NOTE — Progress Notes (Signed)
#   4. S/W KERRY  @ [facility]   BRILINTA 90 MG BID   COVER- YES  CO-PAY- $ 47.00  PRIOR APPROVAL- NO  DEDUCTIBLE - NO   [facility]Y [facility]   MAIL-ORDER FOR 90 DAY SUPPLY $ 141.00

## 2017-07-10 NOTE — ED Triage Notes (Signed)
Mid-chest pain for several hours no nausea  Vomiting or sob

## 2017-07-10 NOTE — H&P (Signed)
History and Physical   Patient ID: Charles Purllio C Mcclary, MRN: [id number], DOB: [birth year]   Date of Encounter: 07/10/2017, 4:22 AM  Primary Care Provider: Patient, No Pcp Per Cardiologist: NA Electrophysiologist:  NA  Chief Complaint:  CP  History of Present Illness: Charles Vance is a 56 y.o. male with no known PMHx, has not seen MD in >2 years and takes no chronic medications, presented to ED tonight after sudden onset of midsternal chest pressure/discomfort at around midnight this morning. He was watching TV at the time. He had no prior issue w/ rest or exertional chest discomfort or DOE. He says the discomfort was a pressure or heaviness in the center of his chest, radiating to the L side and down into his arm. It was associated with mild SOB. It was persistent, lasting over an hour, and did not seem to improve no matter what he tried. He tried changing positions, taking TUMS, and eating something to see if the pain would subside but it did not. It continued to worsen, and he had family members bring him to the ED for further evaluation. BP was elevated on arrival to ED. Pt is a smoker.  Past Medical History:  Diagnosis Date  . Known health problems: none     Past Surgical History:  Procedure Laterality Date  . ARTERY REPAIR Right 1970   laceration right elbow with artery involvement     Prior to Admission medications   Medication Sig Start Date End Date Taking? Authorizing Provider  cyclobenzaprine (FLEXERIL) 10 MG tablet Take 1 tablet (10 mg total) by mouth 2 (two) times daily as needed for muscle spasms. 05/13/13   Jimmie Mollyoll, Paolo, MD     Allergies: No Known Allergies  Social History:  The patient  reports that he has been smoking Cigarettes.  He has a 30.00 pack-year smoking history. He has never used smokeless tobacco. He reports that he drinks about 3.6 oz of alcohol per week . He reports that he uses drugs, including Marijuana and Cocaine, about 2 times per week.   Family  History:  The patient's family history is not on file.  No FHx early CAD or MI per pt  ROS:  Please see the history of present illness.     All other systems reviewed and negative.   Vital Signs: Blood pressure (!) 166/114, pulse (!) 101, temperature 97.7 F (36.5 C), temperature source Oral, resp. rate 18, height 5\' 10"  (1.778 m), weight 99.8 kg (220 lb), SpO2 98 %.  PHYSICAL EXAM: General:  Well nourished, well developed, pt appears to be in mild distress HEENT: normal Lymph: no adenopathy Neck: no JVD Endocrine:  No thryomegaly Vascular: No carotid bruits; DP pulses 2+ bilaterally  Cardiac:  normal S1, S2; +S3 gallop. RRR; no murmur  Lungs:  Few bibasilar rales Abd: soft, nontender, no hepatomegaly  Ext: no edema Musculoskeletal:  No deformities, BUE and BLE strength normal and equal Skin: warm and dry  Neuro:  CNs 2-12 intact, no focal abnormalities noted Psych:  Normal affect   EKG:  ST with 1-532mm ST elevation in anterolateral leads  Labs:   Lab Results  Component Value Date   WBC 12.5 (H) 08/08/2007   HGB 15.9 08/08/2007   HCT 45.1 08/08/2007   MCV 90.2 08/08/2007   PLT 410 (H) 08/08/2007   No results for input(s): NA, K, CL, CO2, BUN, CREATININE, CALCIUM, PROT, BILITOT, ALKPHOS, ALT, AST, GLUCOSE in the last 168 hours.  Invalid input(s): LABALBU No results  for input(s): CKTOTAL, CKMB, TROPONINI in the last 72 hours. Troponin (Point of Care Test) No results for input(s): TROPIPOC in the last 72 hours.  No results found for: CHOL, HDL, LDLCALC, TRIG No results found for: DDIMER  Radiology/Studies:  No results found.    ASSESSMENT AND PLAN:   1. STEMI: to cath lab for urgent evaluation. Plan for routine TTE post-procedure to eval LVEF  2. HTN: will add BB, ACEi and titrate for adequate BP control   3. Risk factor modification: will check TSH, FLP, HgbA1c. Start high-intensity statin  Thank you for the opportunity to participate in the care of this very  pleasant patient. Will follow. Please call w/ questions.   Precious Reel, MD , [facility] 07/10/17 4:29 AM

## 2017-07-10 NOTE — ED Triage Notes (Signed)
The pt did cocaine at 2000  He reports a small amount

## 2017-07-10 NOTE — Progress Notes (Signed)
    07/10/17 0500  Clinical Encounter Type  Visited With Family;Patient and family together  Visit Type Other (Comment);ED (STEMI page)  Consult/Referral To Chaplain  Recommendations continued spiritual care  Spiritual Encounters  Spiritual Needs Emotional  Stress Factors  Family Stress Factors Health changes    Answered STEMI page at 4AM. Arrived at ED, pt's sons and sons' friend were present. Was told grandmother was on her way to be with the boys. Friend's mother came and picked him up. Pt was alert and speaking with doctor.  [facility] room while pt was taken to cath lab. Pt's mother and father arrived with a brother of pt. Family in good spirits. Waiting on results from cath lab. Practiced ministry of presence.

## 2017-07-10 NOTE — Care Management Note (Addendum)
Case Management Note  Patient Details  Name: Royden Purllio C Ambrosius MRN: [id number] Date of Birth: [birth year]  Subjective/Objective:  From home,  Presents with chest pain, hx of psa, s/p urgent pci/stent.  NCM awaiting benefit check. NCM gave patient the $5 co pay card and he has 47. 00 co pay, [facility] [address] [facility].   # 4. S/W KERRY  @ [facility]   BRILINTA 90 MG BID   COVER- YES  CO-PAY- $ 47.00  PRIOR APPROVAL- NO  DEDUCTIBLE - NO   [facility]   MAIL-ORDER FOR 90 DAY SUPPLY $ 141.00                Action/Plan: NCM will follow for dc needs.   Expected Discharge Date:                  Expected Discharge Plan:     In-House Referral:     Discharge planning Services  CM Consult  Post Acute Care Choice:    Choice offered to:     DME Arranged:    DME Agency:     HH Arranged:    HH Agency:     Status of Service:  In process, will continue to follow  If discussed at Long Length of Stay Meetings, dates discussed:    Additional Comments:  Leone Havenaylor, Aracelia Brinson Clinton, RN 07/10/2017, 10:11 AM

## 2017-07-10 NOTE — Progress Notes (Signed)
  Spoke briefly with patient regarding new diabetes diagnosis.  Discussed need for lifestyle modification including eliminating sugar from beverages, increased exercise and quitting smoking.  He states that there is a family history.  Patient seems overwhelmed by new diagnosis.  We discussed that he will likely be started on PO medications for diabetes at d/c and the importance of f/u with PCP.  He has "Living Well with Diabetes" booklet at bedside.  Will also order diabetes videos and RD consult as well due to new diabetes diagnosis.  Will see pt. On 07/11/17 again.    Thanks, Beryl MeagerJenny Marcia Hartwell, RN, BC-ADM Inpatient Diabetes Coordinator Pager [phone] (8a-5p)

## 2017-07-10 NOTE — Progress Notes (Signed)
Inpatient Diabetes Program Recommendations  AACE/ADA: New Consensus Statement on Inpatient Glycemic Control (2015)  Target Ranges:  Prepandial:   less than 140 mg/dL      Peak postprandial:   less than 180 mg/dL (1-2 hours)      Critically ill patients:  140 - 180 mg/dL   Lab Results  Component Value Date   HGBA1C 9.5 (H) 07/10/2017    Review of Glycemic ControlResults for Charles Vance, Charles C (MRN [id number]) as of 07/10/2017 11:20  Ref. Range 07/10/2017 03:57  Glucose Latest Ref Range: 65 - 99 mg/dL 409329 (H)    Diabetes history: No history noted Outpatient Diabetes medications: None noted Current orders for Inpatient glycemic control:  None  Inpatient Diabetes Program Recommendations:    Note elevated A1C and lab glucose.  No history of diabetes noted.  Please add Novolog correction moderate tid with meals and HS.  Also based on A1C, ? New diagnosis of diabetes.  Will need survival skill education if this is new diagnosis.   Thanks, Beryl MeagerJenny Tegan Burnside, RN, BC-ADM Inpatient Diabetes Coordinator Pager [phone] (8a-5p)

## 2017-07-10 NOTE — Progress Notes (Signed)
Progress Note  Patient Name: Charles Vance Date of Encounter: 07/10/2017  Primary Cardiologist: Allyson Sabal  Subjective   No further chest pain this morning.  Inpatient Medications    Scheduled Meds: . [START ON 07/11/2017] aspirin EC  81 mg Oral Daily  . atorvastatin  80 mg Oral q1800  . carvedilol  6.25 mg Oral BID WC  . sodium chloride flush  3 mL Intravenous Q12H  . ticagrelor  90 mg Oral BID   Continuous Infusions: . sodium chloride 10 mL/hr (07/10/17 0416)  . sodium chloride    . sodium chloride    . bivalirudin (ANGIOMAX) infusion 5 mg/mL (Cath Lab,ACS,PCI indication) 1.75 mg/kg/hr (07/10/17 0659)   PRN Meds: sodium chloride, acetaminophen, hydrALAZINE, labetalol, nitroGLYCERIN, ondansetron (ZOFRAN) IV, sodium chloride flush   Vital Signs    Vitals:   07/10/17 0530 07/10/17 0545 07/10/17 0600 07/10/17 0700  BP:  (!) 148/100 (!) 153/108 (!) 176/106  Pulse: (!) 0 75 92 72  Resp: (!) 0 16 20 20   Temp:  97.9 F (36.6 C)    TempSrc:  Oral    SpO2: (!) 0% 97% 97% 99%  Weight:  213 lb 10 oz (96.9 kg)    Height:        Intake/Output Summary (Last 24 hours) at 07/10/17 0738 Last data filed at 07/10/17 0700  Gross per 24 hour  Intake            95.56 ml  Output              650 ml  Net          -554.44 ml   Filed Weights   07/10/17 0409 07/10/17 0545  Weight: 220 lb (99.8 kg) 213 lb 10 oz (96.9 kg)    Telemetry    Sinus rhythm - Personally Reviewed  ECG    Normal sinus rhythm at 78 with anterolateral ST segment elevation. - Personally Reviewed  Physical Exam   GEN: No acute distress.   Neck: No JVD Cardiac: RRR, no murmurs, rubs, or gallops.  Respiratory: Clear to auscultation bilaterally. GI: Soft, nontender, non-distended  MS: No edema; No deformity. Neuro:  Nonfocal  Psych: Normal affect   Labs    Chemistry Recent Labs Lab 07/10/17 0357  NA 135  K 3.6  CL 103  CO2 19*  GLUCOSE 329*  BUN 10  CREATININE 1.15  CALCIUM 8.9  PROT 6.5    ALBUMIN 3.9  AST 30  ALT 26  ALKPHOS 74  BILITOT 0.9  GFRNONAA >60  GFRAA >60  ANIONGAP 13     Hematology Recent Labs Lab 07/10/17 0357  WBC 13.5*  RBC 4.87  HGB 15.1  HCT 41.2  MCV 84.6  MCH 31.0  MCHC 36.7*  RDW 12.5  PLT 362    Cardiac Enzymes Recent Labs Lab 07/10/17 0357  TROPONINI 0.13*   No results for input(s): TROPIPOC in the last 168 hours.   BNPNo results for input(s): BNP, PROBNP in the last 168 hours.   DDimer No results for input(s): DDIMER in the last 168 hours.   Radiology    Dg Chest Port 1 View  Result Date: 07/10/2017 CLINICAL DATA:  Code STEMI, chest pain tonight. EXAM: PORTABLE CHEST 1 VIEW COMPARISON:  None. FINDINGS: Cardiac silhouette is upper limits of normal in size. Mediastinal silhouette is nonsuspicious. Mildly calcified aortic knob. Pulmonary vascular congestion and mild interstitial prominence. Bandlike density LEFT lung base. LEFT costophrenic angle incompletely imaged. No visible pleural effusion.  No pneumothorax. Soft tissue planes and included osseous structures are nonsuspicious. IMPRESSION: Borderline cardiomegaly. Pulmonary vascular congestion and interstitial prominence consistent with pulmonary edema. LEFT lung base atelectasis/scarring. Aortic Atherosclerosis (ICD10-I70.0). Electronically Signed   By: Awilda Metroourtnay  Bloomer M.D.   On: 07/10/2017 04:31    Cardiac Studies   Cardiac catheterization/PCI and drug-eluting stent (07/10/17)  Conclusion     Ost 1st Mrg to 1st Mrg lesion, 95 %stenosed.  Prox RCA lesion, 70 %stenosed.  Dist LAD lesion, 100 %stenosed.  Post intervention, there is a 0% residual stenosis.  A stent was successfully placed.  There is mild left ventricular systolic dysfunction.  LV end diastolic pressure is mildly elevated.  The left ventricular ejection fraction is 50-55% by visual estimate.    Patient Profile     56 y.o.  married African-American male father of 2 identical twins who  presented early this morning with an acute anterolateral ST segment elevation myocardial infarction. He does have a history of tobacco abuse. He admits to using crack cocaine earlier in the evening. He was taken emergently to the Cath Lab for an angiography and intervention.  Assessment & Plan    1: Coronary artery disease-postop day 0 distal LAD PCI and drug-eluting stenting in the setting of acute anterolateral ST segment elevation myocardial infarction. The to balloon time was 60 minutes. The procedure was uncomplicated. He is pain-free. His initial troponin was low. He does have residual ostial OM1 disease and moderate codominant RCA disease which will be treated medically at this time. His LV function was normal. He'll be treated with usual post-MI pharmacology including low-dose beta blocker, ACE inhibitor, statin therapy and dual antiplatelet therapy. Given the low risk nature of his anatomy I anticipate discharge Saturday morning.  2: Hyperlipidemia-high-dose statin therapy was initiated  3: Essential hypertension-blood pressure this morning is 141/102. I will initiate low-dose ACE inhibitor.  4: Tobacco abuse-reinforce risk factor modification including smoking cessation.  Alphonsus SiasSigned, Berry, Jonathan, MD  07/10/2017, 7:38 AM

## 2017-07-11 LAB — CBC
HCT: 41.7 % (ref 39.0–52.0)
Hemoglobin: 14.6 g/dL (ref 13.0–17.0)
MCH: 30 pg (ref 26.0–34.0)
MCHC: 35 g/dL (ref 30.0–36.0)
MCV: 85.8 fL (ref 78.0–100.0)
PLATELETS: 356 10*3/uL (ref 150–400)
RBC: 4.86 MIL/uL (ref 4.22–5.81)
RDW: 12.8 % (ref 11.5–15.5)
WBC: 14.9 10*3/uL — AB (ref 4.0–10.5)

## 2017-07-11 LAB — GLUCOSE, CAPILLARY
GLUCOSE-CAPILLARY: 160 mg/dL — AB (ref 65–99)
Glucose-Capillary: 197 mg/dL — ABNORMAL HIGH (ref 65–99)
Glucose-Capillary: 254 mg/dL — ABNORMAL HIGH (ref 65–99)
Glucose-Capillary: 132 mg/dL — ABNORMAL HIGH (ref 65–99)

## 2017-07-11 LAB — BASIC METABOLIC PANEL
Anion gap: 9 (ref 5–15)
BUN: 7 mg/dL (ref 6–20)
CO2: 25 mmol/L (ref 22–32)
CREATININE: 1.04 mg/dL (ref 0.61–1.24)
Calcium: 8.9 mg/dL (ref 8.9–10.3)
Chloride: 104 mmol/L (ref 101–111)
Glucose, Bld: 185 mg/dL — ABNORMAL HIGH (ref 65–99)
POTASSIUM: 3.2 mmol/L — AB (ref 3.5–5.1)
SODIUM: 138 mmol/L (ref 135–145)

## 2017-07-11 NOTE — Progress Notes (Signed)
Per insurance check on Brilinta # 5. S/W ALANA @ [facility] # [phone] OPT- 2   BRILINTA  90 MG BID   COVER- YES  CO-PAY- $ 6.90  PRIOR APPROVAL- NO   PRE[facility]

## 2017-07-11 NOTE — Progress Notes (Signed)
Spoke with patient again regarding new diagnosis of diabetes.  He had several visitors in his room however he wanted me to go ahead and speak with him.  We discussed briefly normal blood sugar values, plate method, and monitoring.  Patient still seems overwhelmed.  Again encouraged follow-up with PCP however patient did not know the name of his PCP.   Patient has not watched diabetes videos yet.  Discussed with patient and nurse.  Also requested that RN teach patient to check blood sugar with next CBG check.   MD, patient will need glucose meter at [phone]).  MD please also consider starting oral agent for diabetes such as Metformin (must wait 48 hour after contrast given to start). Will also place orders for Outpatient diabetes education per protocol.    Thanks, Beryl Meager, RN, BC-ADM Inpatient Diabetes Coordinator Pager [phone] (8a-5p)

## 2017-07-11 NOTE — Progress Notes (Signed)
CARDIAC REHAB PHASE I   PRE:  Rate/Rhythm: 61 SR  BP:  Sitting: 108/77        SaO2: 99 RA  MODE:  Ambulation: 1110 ft   POST:  Rate/Rhythm: 74 SR  BP:  Sitting: 128/86         SaO2: 99 RA  Pt ambulated 1110 ft on RA, independent, steady gait, tolerated well with no complaints. Completed MI/stent education.  Reviewed risk factors, tobacco/substance abuse cessation (gave pt fake cigarette), MI book, anti-platelet therapy, stent card, activity restrictions, ntg, exercise, heart healthy and diabetes diet handouts and phase 2 cardiac rehab. Pt verbalized understanding. Pt agrees to phase 2 cardiac rehab referral, will send to [facility]. Pt to recliner after walk, call bell within reach. Will follow.    [id number] Joylene Grapes, RN, BSN 07/11/2017 12:28 PM

## 2017-07-11 NOTE — Progress Notes (Addendum)
Nutrition Consult/Brief Note  RD consulted for new DM diet education.  Wt Readings from Last 15 Encounters:  07/11/17 208 lb 15.9 oz (94.8 kg)   Body mass index is 29.99 kg/m. Patient meets criteria for Overweight based on current BMI.   Current diet order is Heart Healthy/Carbohydrate Modified, patient is consuming approximately 100% of meals at this time.   Labs and medications reviewed. CBG's [phone].  Irving Burton, RN with Cardiac Rehab speaking/educating pt at time of visit. Did not disturb. Will return at later date.  Maureen Chatters, RD, LDN Pager #: [phone] After-Hours Pager #: [phone]

## 2017-07-11 NOTE — Care Management Note (Signed)
Case Management Note Previous CM note initiated by Leone Haven, RN--07/10/2017, 10:11 AM   Patient Details  Name: Charles Vance MRN: [id number] Date of Birth: Oct 06, 1961  Subjective/Objective:  From home,  Presents with chest pain, hx of psa, s/p urgent pci/stent.  NCM awaiting benefit check. NCM gave patient the $5 co pay card and he has 47. 00 co pay, [facility] [address] [facility].   # 4. S/W KERRY  @ [facility]   BRILINTA 90 MG BID   COVER- YES  CO-PAY- $ 47.00  PRIOR APPROVAL- NO  DEDUCTIBLE - NO   [facility]ACY [facility]   MAIL-ORDER FOR 90 DAY SUPPLY $ 141.00                Action/Plan: NCM will follow for dc needs.   Expected Discharge Date:  07/12/17               Expected Discharge Plan:  Home/Self Care  In-House Referral:     Discharge planning Services  CM Consult, Medication Assistance  Post Acute Care Choice:  NA Choice offered to:  NA  DME Arranged:    DME Agency:     HH Arranged:    HH Agency:     Status of Service:  Completed, signed off  If discussed at [employer] of Stay Meetings, dates discussed:    Discharge Disposition: home/self care   Additional Comments:  07/11/17- 1545- Donn Pierini RN, CM- spoke with pt at bedside- pt already has $5 copay card- and per yesterday benefit check copay came back as $47 - today's check came back as $6.90- whichever way- pt will use copay card for $5-  [facility] [address]-  No further needs  Darrold Span, RN 07/11/2017, 3:49 PM [phone]

## 2017-07-11 NOTE — Progress Notes (Signed)
Progress Note  Patient Name: Charles Vance Date of Encounter: 07/11/2017  Primary Cardiologist: Allyson Sabal  Subjective   No further chest pain this morning.Postop day #1 anteroapical STEMI treated with PCI and drug-eluting stenting.  Inpatient Medications    Scheduled Meds: . aspirin EC  81 mg Oral Daily  . atorvastatin  80 mg Oral q1800  . carvedilol  6.25 mg Oral BID WC  . insulin aspart  0-15 Units Subcutaneous TID WC  . insulin aspart  0-5 Units Subcutaneous QHS  . lisinopril  5 mg Oral Daily  . pneumococcal 23 valent vaccine  0.5 mL Intramuscular Tomorrow-1000  . sodium chloride flush  3 mL Intravenous Q12H  . ticagrelor  90 mg Oral BID   Continuous Infusions: . sodium chloride Stopped (07/10/17 0746)  . sodium chloride     PRN Meds: sodium chloride, acetaminophen, nitroGLYCERIN, ondansetron (ZOFRAN) IV, sodium chloride flush   Vital Signs    Vitals:   07/11/17 0400 07/11/17 0500 07/11/17 0600 07/11/17 0734  BP: 100/82 106/77 115/65   Pulse: 69 68 63   Resp: (!) 21 (!) 21 (!) 22   Temp:    98.2 F (36.8 C)  TempSrc:    Oral  SpO2: 95% 97% 96%   Weight:   208 lb 15.9 oz (94.8 kg)   Height:        Intake/Output Summary (Last 24 hours) at 07/11/17 0747 Last data filed at 07/10/17 2200  Gross per 24 hour  Intake           1312.1 ml  Output             1950 ml  Net           -637.9 ml   Filed Weights   07/10/17 0409 07/10/17 0545 07/11/17 0600  Weight: 220 lb (99.8 kg) 213 lb 10 oz (96.9 kg) 208 lb 15.9 oz (94.8 kg)    Telemetry    Sinus rhythm - Personally Reviewed  ECG    Normal sinus rhythm at 78 with anterolateral ST segment elevation And biphasic T waves consistent with evolution of his anterior STEMI. - Personally Reviewed  Physical Exam   GEN: No acute distress.   Neck: No JVD Cardiac: RRR, no murmurs, rubs, or gallops.  Respiratory: Clear to auscultation bilaterally. GI: Soft, nontender, non-distended  MS: No edema; No  deformity. Neuro:  Nonfocal  Psych: Normal affect  Extremity: Right radial puncture site well-healed   Labs    Chemistry  Recent Labs Lab 07/10/17 0357 07/11/17 0212  NA 135 138  K 3.6 3.2*  CL 103 104  CO2 19* 25  GLUCOSE 329* 185*  BUN 10 7  CREATININE 1.15 1.04  CALCIUM 8.9 8.9  PROT 6.5  --   ALBUMIN 3.9  --   AST 30  --   ALT 26  --   ALKPHOS 74  --   BILITOT 0.9  --   GFRNONAA >60 >60  GFRAA >60 >60  ANIONGAP 13 9     Hematology  Recent Labs Lab 07/10/17 0357 07/11/17 0212  WBC 13.5* 14.9*  RBC 4.87 4.86  HGB 15.1 14.6  HCT 41.2 41.7  MCV 84.6 85.8  MCH 31.0 30.0  MCHC 36.7* 35.0  RDW 12.5 12.8  PLT 362 356    Cardiac Enzymes  Recent Labs Lab 07/10/17 0357 07/10/17 0552 07/10/17 1102 07/10/17 2045  TROPONINI 0.13* 5.40* 19.06* 8.47*   No results for input(s): TROPIPOC in the last 168  hours.   BNPNo results for input(s): BNP, PROBNP in the last 168 hours.   DDimer No results for input(s): DDIMER in the last 168 hours.   Radiology    Dg Chest Port 1 View  Result Date: 07/10/2017 CLINICAL DATA:  Code STEMI, chest pain tonight. EXAM: PORTABLE CHEST 1 VIEW COMPARISON:  None. FINDINGS: Cardiac silhouette is upper limits of normal in size. Mediastinal silhouette is nonsuspicious. Mildly calcified aortic knob. Pulmonary vascular congestion and mild interstitial prominence. Bandlike density LEFT lung base. LEFT costophrenic angle incompletely imaged. No visible pleural effusion. No pneumothorax. Soft tissue planes and included osseous structures are nonsuspicious. IMPRESSION: Borderline cardiomegaly. Pulmonary vascular congestion and interstitial prominence consistent with pulmonary edema. LEFT lung base atelectasis/scarring. Aortic Atherosclerosis (ICD10-I70.0). Electronically Signed   By: Awilda Metro M.D.   On: 07/10/2017 04:31    Cardiac Studies   Cardiac catheterization/PCI and drug-eluting stent (07/10/17)  Conclusion     Ost 1st  Mrg to 1st Mrg lesion, 95 %stenosed.  Prox RCA lesion, 70 %stenosed.  Dist LAD lesion, 100 %stenosed.  Post intervention, there is a 0% residual stenosis.  A stent was successfully placed.  There is mild left ventricular systolic dysfunction.  LV end diastolic pressure is mildly elevated.  The left ventricular ejection fraction is 50-55% by visual estimate.   2-D echocardiogram (01/11/17)  Study Conclusions  - Left ventricle: The cavity size was normal. There was mild   concentric hypertrophy. Systolic function was mildly reduced. The   estimated ejection fraction was in the range of 45% to 50%.   Hypokinesis of the apex and the apical segments of the anterior,   lateral, inferior myocardium; consistent with ischemia in the   distribution of the left anterior descending coronary artery. No   evidence of thrombus. - Left atrium: The atrium was mildly dilated.   Patient Profile     56 y.o.  married African-American male father of 2 identical twins who presented early yesterday morning with an acute anterolateral ST segment elevation myocardial infarction. He does have a history of tobacco abuse. He admits to using crack cocaine earlier in the evening. He was taken emergently to the Cath Lab for an angiography and intervention. He had an occluded apical LAD and high-grade ostial first circumflex obtuse marginal branch stenosis. His EF was preserved with low anteroapical severe hypokinesia. He underwent PCI and drug-eluting stenting of his apical LAD successfully. Troponin was 20. His EF by 2-D echo was 45-50% with wall motion and LV and the distal LAD distribution. He had no recurrent symptoms on dual antiplatelet therapy.  Assessment & Plan    1: Coronary artery disease-postop day 0 distal LAD PCI and drug-eluting stenting in the setting of acute anterolateral ST segment elevation myocardial infarction. The to balloon time was 60 minutes. The procedure was uncomplicated. He Remains  pain-free. His initial troponin was low although his peak troponin rose to 20.Marland Kitchen He does have residual ostial OM1 disease and moderate codominant RCA disease which will be treated medically at this time. His LV function was low normal angiography however 2-D echo showed an EF in the 45-50% range with a low anteroapical wall motion abnormality.Marland Kitchen He'll be treated with usual post-MI pharmacology including low-dose beta blocker, ACE inhibitor, statin therapy and dual antiplatelet therapy. Given the low risk nature of his anatomy I anticipate discharge Saturday morning.  2: Hyperlipidemia-high-dose statin therapy was initiated  3: Essential hypertension-blood pressure this morning is 141/102. I will initiate low-dose ACE inhibitor.  4:  Tobacco abuse-reinforce risk factor modification including smoking cessation.  Alphonsus Sias, MD  07/11/2017, 7:47 AM

## 2017-07-12 ENCOUNTER — Encounter (HOSPITAL_COMMUNITY): Payer: Self-pay | Admitting: Physician Assistant

## 2017-07-12 ENCOUNTER — Encounter: Payer: Self-pay | Admitting: Physician Assistant

## 2017-07-12 DIAGNOSIS — Z9861 Coronary angioplasty status: Secondary | ICD-10-CM

## 2017-07-12 DIAGNOSIS — E785 Hyperlipidemia, unspecified: Secondary | ICD-10-CM

## 2017-07-12 DIAGNOSIS — I2510 Atherosclerotic heart disease of native coronary artery without angina pectoris: Secondary | ICD-10-CM

## 2017-07-12 DIAGNOSIS — Z720 Tobacco use: Secondary | ICD-10-CM

## 2017-07-12 DIAGNOSIS — E118 Type 2 diabetes mellitus with unspecified complications: Secondary | ICD-10-CM

## 2017-07-12 DIAGNOSIS — I10 Essential (primary) hypertension: Secondary | ICD-10-CM

## 2017-07-12 DIAGNOSIS — E876 Hypokalemia: Secondary | ICD-10-CM

## 2017-07-12 DIAGNOSIS — F1410 Cocaine abuse, uncomplicated: Secondary | ICD-10-CM

## 2017-07-12 DIAGNOSIS — I255 Ischemic cardiomyopathy: Secondary | ICD-10-CM

## 2017-07-12 HISTORY — DX: Tobacco use: Z72.0

## 2017-07-12 HISTORY — DX: Hyperlipidemia, unspecified: E78.5

## 2017-07-12 HISTORY — DX: Type 2 diabetes mellitus with unspecified complications: E11.8

## 2017-07-12 HISTORY — DX: Ischemic cardiomyopathy: I25.5

## 2017-07-12 HISTORY — DX: Essential (primary) hypertension: I10

## 2017-07-12 HISTORY — DX: Atherosclerotic heart disease of native coronary artery without angina pectoris: I25.10

## 2017-07-12 HISTORY — DX: Cocaine abuse, uncomplicated: F14.10

## 2017-07-12 LAB — GLUCOSE, CAPILLARY
GLUCOSE-CAPILLARY: 232 mg/dL — AB (ref 65–99)
Glucose-Capillary: 174 mg/dL — ABNORMAL HIGH (ref 65–99)

## 2017-07-12 MED ORDER — LISINOPRIL 5 MG PO TABS: 5.0000 mg | ORAL_TABLET | Freq: Every day | ORAL | 11 refills | Status: DC

## 2017-07-12 MED ORDER — NITROGLYCERIN 0.4 MG SL SUBL: 0.4000 mg | SUBLINGUAL_TABLET | SUBLINGUAL | 11 refills | Status: AC | PRN

## 2017-07-12 MED ORDER — METFORMIN HCL 500 MG PO TABS: 500.0000 mg | ORAL_TABLET | Freq: Two times a day (BID) | ORAL | 2 refills | Status: DC

## 2017-07-12 MED ORDER — TICAGRELOR 90 MG PO TABS: 90.0000 mg | ORAL_TABLET | Freq: Two times a day (BID) | ORAL | 11 refills | Status: DC

## 2017-07-12 MED ORDER — ASPIRIN 81 MG PO TBEC: 81.0000 mg | DELAYED_RELEASE_TABLET | Freq: Every day | ORAL | Status: AC

## 2017-07-12 MED ORDER — CARVEDILOL 6.25 MG PO TABS: 6.2500 mg | ORAL_TABLET | Freq: Two times a day (BID) | ORAL | 11 refills | Status: DC

## 2017-07-12 MED ORDER — ATORVASTATIN CALCIUM 80 MG PO TABS: 80.0000 mg | ORAL_TABLET | Freq: Every day | ORAL | 11 refills | Status: DC

## 2017-07-12 MED ORDER — POTASSIUM CHLORIDE CRYS ER 20 MEQ PO TBCR
40.0000 meq | EXTENDED_RELEASE_TABLET | Freq: Once | ORAL | Status: AC
  Administered 2017-07-12: 40 meq via ORAL
  Filled 2017-07-12: qty 2

## 2017-07-12 NOTE — Discharge Instructions (Signed)
1. Do NOT start the Metformin until Sunday, 07/13/17  2. Do NOT miss a dose of Brilinta (Ticagrelor).  3. Please arrange an appointment with a primary care doctor as soon as possible. You will need a primary care provider to manage your diabetes.

## 2017-07-12 NOTE — Care Management Note (Signed)
Case Management Note  Patient Details  Name: ERJON KLUESNER MRN: [id number] Date of Birth: [birth year]  Subjective/Objective:  CM received call for assist with PCP.                   Action/Plan: pt was open to calling [facility] at [phone]. If he is unable to find PCP there he also has the option of accessing [facility] through [employer], his [employer] .CM will sign off for now but will be available should additional discharge needs arise or disposition change.    Expected Discharge Date:  07/12/17               Expected Discharge Plan:  Home/Self Care  In-House Referral:     Discharge planning Services  CM Consult, Medication Assistance  Post Acute Care Choice:  NA Choice offered to:  NA  DME Arranged:    DME Agency:     HH Arranged:    HH Agency:     Status of Service:  Completed, signed off  If discussed at [employer] of Stay Meetings, dates discussed:    Additional Comments:  Yvone Neu, RN 07/12/2017, 11:05 AM

## 2017-07-12 NOTE — Discharge Summary (Addendum)
Discharge Summary    Patient ID: Charles Vance,  MRN: [id number], DOB/AGE: Jul 07, 1961 56 y.o.  Admit date: 07/10/2017 Discharge date: 07/12/2017  Primary Care Provider: Patient, No Pcp Per Primary Cardiologist: Dr. Nanetta Batty   Discharge Diagnoses    Principal Problem:   Acute ST elevation myocardial infarction (STEMI) involving left anterior descending (LAD) coronary artery [facility]) Active Problems:   CAD (coronary artery disease)   Ischemic cardiomyopathy   Essential hypertension   Hyperlipidemia   Type 2 diabetes mellitus with complication (HCC)   Tobacco abuse   Cocaine abuse   Hypokalemia   Allergies No Known Allergies  Diagnostic Studies/Procedures    Echo 07/10/17 - Left ventricle: The cavity size was normal. There was mild   concentric hypertrophy. Systolic function was mildly reduced. The   estimated ejection fraction was in the range of 45% to 50%.   Hypokinesis of the apex and the apical segments of the anterior,   lateral, inferior myocardium; consistent with ischemia in the   distribution of the left anterior descending coronary artery. No   evidence of thrombus. - Left atrium: The atrium was mildly dilated.  Cardiac Catheterization 07/10/17 LAD distal 100 OM1 ostial 95 RCA proximal 70 EF 50-55, distal apical HK PCI: 2.25 x 16 mm Synergy DES to the distal LAD        Final impression: Successful urgent PCI and drug-eluting stenting of the distal LAD using a synergy drug-eluting stent. The portal balloon time was 65 minutes and the Cath Lab to device time was 32 minutes. The patient tolerated the procedure well. He will be treated with dual antiplatelet therapy. Cardiac risk factor modification will be pursued. He'll be fast tracked and most likely discharged within 48 hours.  _____________   History of Present Illness     Charles Vance is a 56 y.o. male with a hx of tobacco abuse and crack cocaine use presented on 07/10/2017 with sudden onset  of midsternal chest pain around midnight.  ECG demonstrated anterolateral ST segment elevation consistent with an ST segment elevation myocardial infarction.  Of note, he did admit to using crack cocaine earlier in the evening.   Hospital Course     Consultants: Diabetes Coordinator  Charles Vance was taken emergently to the Cath Lab for an angiography and intervention. He had an occluded apical LAD and high-grade ostial first circumflex obtuse marginal branch stenosis. His EF was preserved with low anteroapical severe hypokinesia. He underwent successful PCI with drug-eluting stenting of his apical LAD. Troponin peaked at 19.06.  An echocardiogram did demonstrate mildly reduced LV function with ejection fraction 45-50% and apical and apical anterior, lateral and inferior hypokinesis.  He has done well since his PCI without recurrent chest pain.  His Troponin has trended downward (8.47 today).  He was evaluated by cardiac rehabilitation and outpatient referral was made.    Of note, his hemoglobin A1c is 9.5.  He has been covered with sliding scale insulin.  He was seen by the diabetic coordinator and outpatient diabetes education was ordered.  He will be sent home on Metformin 500 mg Twice daily (starting 07/13/17).  He will need follow up with primary care and he has been encouraged to establish with a PCP.    07/12/2017 -  He was evaluated this morning by Dr. Delane Ginger. He is doing well without chest pain.  His radial site is stable.   Physical Exam: BP 102/81   Pulse 65   Temp 98  F (36.7 C) (Oral)   Resp 17   Ht 5\' 10"  (1.778 m)   Wt 206 lb 14.4 oz (93.8 kg)   SpO2 98%   BMI 29.69 kg/m   GEN: No acute distress.   Neck: No JVD Cardiac: RRR, no murmurs, rubs, or gallops.  Respiratory: Clear to auscultation bilaterally. GI: Soft, nontender, non-distended  MS: No edema; No deformity. Neuro:  Nonfocal  Psych: Normal affect   His potassium is low this AM and he will be given 1 dose of  K-Dur 40 mEq.  He is felt to be stable for discharge to home.  He will need follow up with Dr. Nanetta Batty or PA/NP in 7 days (TCM).  He will need to establish with primary care.   _____________  Discharge Vitals Blood pressure 102/81, pulse 65, temperature 98 F (36.7 C), temperature source Oral, resp. rate 17, height 5\' 10"  (1.778 m), weight 206 lb 14.4 oz (93.8 kg), SpO2 98 %.  Filed Weights   07/10/17 0545 07/11/17 0600 07/12/17 0559  Weight: 213 lb 10 oz (96.9 kg) 208 lb 15.9 oz (94.8 kg) 206 lb 14.4 oz (93.8 kg)    Labs & Radiologic Studies    CBC  Recent Labs  07/10/17 0357 07/11/17 0212  WBC 13.5* 14.9*  NEUTROABS 7.7  --   HGB 15.1 14.6  HCT 41.2 41.7  MCV 84.6 85.8  PLT 362 356   Basic Metabolic Panel  Recent Labs  07/10/17 0357 07/11/17 0212  NA 135 138  K 3.6 3.2*  CL 103 104  CO2 19* 25  GLUCOSE 329* 185*  BUN 10 7  CREATININE 1.15 1.04  CALCIUM 8.9 8.9   Liver Function Tests  Recent Labs  07/10/17 0357  AST 30  ALT 26  ALKPHOS 74  BILITOT 0.9  PROT 6.5  ALBUMIN 3.9   No results for input(s): LIPASE, AMYLASE in the last 72 hours. Cardiac Enzymes  Recent Labs  07/10/17 0552 07/10/17 1102 07/10/17 2045  TROPONINI 5.40* 19.06* 8.47*   BNP Invalid input(s): POCBNP D-Dimer No results for input(s): DDIMER in the last 72 hours. Hemoglobin A1C  Recent Labs  07/10/17 0552  HGBA1C 9.5*   Fasting Lipid Panel  Recent Labs  07/10/17 0357  CHOL 214*  HDL 31*  LDLCALC 123*  TRIG 302*  CHOLHDL 6.9   Thyroid Function Tests  Recent Labs  07/10/17 0552  TSH 0.526   Lab Results  Component Value Date   COCAINSCRNUR POSITIVE (A) 07/10/2017    _____________  Dg Chest Port 1 View  Result Date: 07/10/2017 CLINICAL DATA:  Code STEMI, chest pain tonight. EXAM: PORTABLE CHEST 1 VIEW COMPARISON:  None. FINDINGS: Cardiac silhouette is upper limits of normal in size. Mediastinal silhouette is nonsuspicious. Mildly calcified aortic  knob. Pulmonary vascular congestion and mild interstitial prominence. Bandlike density LEFT lung base. LEFT costophrenic angle incompletely imaged. No visible pleural effusion. No pneumothorax. Soft tissue planes and included osseous structures are nonsuspicious. IMPRESSION: Borderline cardiomegaly. Pulmonary vascular congestion and interstitial prominence consistent with pulmonary edema. LEFT lung base atelectasis/scarring. Aortic Atherosclerosis (ICD10-I70.0). Electronically Signed   By: Awilda Metro M.D.   On: 07/10/2017 04:31   Disposition   Pt is being discharged home today in good condition.  Follow-up Plans & Appointments    Follow-up Information    Runell Gess, MD Follow up in 1 week(s).   Specialties:  Cardiology, Radiology Why:  The office will call you to arrange a follow up with Dr.  Berry or one of the PAs or NPs in the next 1 week.  Contact information: [address] [place] [address] [phone]          Discharge Instructions    AMB Referral to Cardiac Rehabilitation - Phase II    Complete by:  As directed    Diagnosis:  STEMI   AMB Referral to Phase II Cardiac Rehab    Complete by:  As directed    Diagnosis:  STEMI   Amb Referral to Cardiac Rehabilitation    Complete by:  As directed    Diagnosis:   Coronary Stents STEMI     Diet - low sodium heart healthy    Complete by:  As directed    Diet Carb Modified    Complete by:  As directed    Discharge wound care:    Complete by:  As directed    Call [phone] for swelling, bleeding, bruising or fever   Driving Restrictions    Complete by:  As directed    None for 1 week   Increase activity slowly    Complete by:  As directed    Lifting restrictions    Complete by:  As directed    No lifting for 2 weeks   Sexual Activity Restrictions    Complete by:  As directed    None for 2 weeks      Discharge Medications   Current Discharge Medication List    START taking  these medications   Details  aspirin EC 81 MG EC tablet Take 1 tablet (81 mg total) by mouth daily.    atorvastatin (LIPITOR) 80 MG tablet Take 1 tablet (80 mg total) by mouth daily at 6 PM. Qty: 30 tablet, Refills: 11    carvedilol (COREG) 6.25 MG tablet Take 1 tablet (6.25 mg total) by mouth 2 (two) times daily with a meal. Qty: 60 tablet, Refills: 11    lisinopril (PRINIVIL,ZESTRIL) 5 MG tablet Take 1 tablet (5 mg total) by mouth daily. Qty: 30 tablet, Refills: 11    metFORMIN (GLUCOPHAGE) 500 MG tablet Take 1 tablet (500 mg total) by mouth 2 (two) times daily with a meal. Qty: 60 tablet, Refills: 2    nitroGLYCERIN (NITROSTAT) 0.4 MG SL tablet Place 1 tablet (0.4 mg total) under the tongue every 5 (five) minutes x 3 doses as needed for chest pain. Qty: 25 tablet, Refills: 11    ticagrelor (BRILINTA) 90 MG TABS tablet Take 1 tablet (90 mg total) by mouth 2 (two) times daily. Qty: 60 tablet, Refills: 11         Aspirin prescribed at discharge?  Yes High Intensity Statin Prescribed? (Lipitor 40-80mg  or Crestor 20-40mg ): Yes Beta Blocker Prescribed? Yes For EF <40%, was ACEI/ARB Prescribed? Yes ADP Receptor Inhibitor Prescribed? (i.e. Plavix etc.-Includes Medically Managed Patients): Yes - Brilinta For EF <40%, Aldosterone Inhibitor Prescribed? No: EF > 40 Was EF assessed during THIS hospitalization? Yes Was Cardiac Rehab II ordered? (Included Medically managed Patients): Yes   Outstanding Labs/Studies   1. BMET at follow up appointment  2. Lipids and LFTs in 8-12 weeks.   Duration of Discharge Encounter   Greater than 30 minutes including physician time.  Signed, Tereso Newcomer PA-C 07/12/2017, 9:35 AM   Attending Note:   The patient was seen and examined.  Agree with assessment and plan as noted above.  Changes made to the above note as needed.  Patient seen and independently examined with Tereso Newcomer, PA .  We discussed all aspects of the encounter. I agree with  the assessment and plan as stated above.  1. CAD :  S/p PCI Doing well No angina with ambulation DC to home.   Will need a week off work - he does a very physical job.  2. DM :  Does not have a primary MD.   Will start metformin, Encouraged him to find a primary MD quickly .    I have spent a total of 40 minutes with patient reviewing hospital  notes , telemetry, EKGs, labs and examining patient as well as establishing an assessment and plan that was discussed with the patient. > 50% of time was spent in direct patient care.    Vesta Mixer, Montez Hageman., MD, [facility] 07/12/2017, 9:53 AM [address],  [address] Office [phone] Pager [phone]

## 2017-07-15 ENCOUNTER — Telehealth: Payer: Self-pay | Admitting: Cardiovascular Disease

## 2017-07-15 ENCOUNTER — Telehealth (HOSPITAL_COMMUNITY): Payer: Self-pay

## 2017-07-15 NOTE — Telephone Encounter (Signed)
Attempted to reach patient. No answer or VM pickup on home line.  No answer when cell # dialed, and VM not set up to receive messages.

## 2017-07-15 NOTE — Telephone Encounter (Signed)
Patient insurance is active and benefits verified. Patient insurance is BCBS - no co-payment, deductible $1080/$0 has been met, out of pocket $4388/$0 has been met, no co-insurance and no pre-authorization.

## 2017-07-15 NOTE — Telephone Encounter (Signed)
TOC Phone Call. Appt is on 07/23/17 w/ Corine Shelter

## 2017-07-16 NOTE — Telephone Encounter (Signed)
No answer or voicemail to leave message 

## 2017-07-18 NOTE — Telephone Encounter (Signed)
Patient contacted regarding discharge from 07/10/2017 - 07/12/2017 (2 days), [facility].  Patient understands to follow up with provider  Appt is on 07/23/17 w/ Corine Shelter at 2pm at [place] office. Patient understands discharge instructions? yes Patient understands medications and regiment? yes Patient understands to bring all medications to this visit? Yes, pt will arrive early for paperwork.  Pt's Cath procedure was 07-10-2017, should be ok to follow RTW date listed(07-21-17) then come for follow up appt.

## 2017-07-18 NOTE — Telephone Encounter (Signed)
New message      Pt was recently released from hosp.  His return to work note says return on 07-21-17 but his hosp follow up appt is 07-23-17.  He is calling to see if his note should say return to work on 07-23-17 after follow up appt?  Please call

## 2017-07-23 ENCOUNTER — Ambulatory Visit (INDEPENDENT_AMBULATORY_CARE_PROVIDER_SITE_OTHER): Payer: BC Managed Care – PPO | Admitting: Cardiology

## 2017-07-23 ENCOUNTER — Encounter: Payer: Self-pay | Admitting: Cardiology

## 2017-07-23 VITALS — BP 128/70 | HR 73 | Ht 70.5 in | Wt 208.0 lb

## 2017-07-23 DIAGNOSIS — Z720 Tobacco use: Secondary | ICD-10-CM | POA: Diagnosis not present

## 2017-07-23 DIAGNOSIS — I2510 Atherosclerotic heart disease of native coronary artery without angina pectoris: Secondary | ICD-10-CM

## 2017-07-23 DIAGNOSIS — I10 Essential (primary) hypertension: Secondary | ICD-10-CM

## 2017-07-23 DIAGNOSIS — I255 Ischemic cardiomyopathy: Secondary | ICD-10-CM

## 2017-07-23 DIAGNOSIS — E785 Hyperlipidemia, unspecified: Secondary | ICD-10-CM | POA: Diagnosis not present

## 2017-07-23 DIAGNOSIS — E118 Type 2 diabetes mellitus with unspecified complications: Secondary | ICD-10-CM

## 2017-07-23 DIAGNOSIS — F1410 Cocaine abuse, uncomplicated: Secondary | ICD-10-CM

## 2017-07-23 DIAGNOSIS — I2102 ST elevation (STEMI) myocardial infarction involving left anterior descending coronary artery: Secondary | ICD-10-CM | POA: Diagnosis not present

## 2017-07-23 DIAGNOSIS — Z9861 Coronary angioplasty status: Secondary | ICD-10-CM | POA: Diagnosis not present

## 2017-07-23 NOTE — Assessment & Plan Note (Signed)
LDL 123, HDL 31

## 2017-07-23 NOTE — Assessment & Plan Note (Signed)
New diagnosis Aug 2018

## 2017-07-23 NOTE — Assessment & Plan Note (Signed)
Down from 1ppd to 2 cigs a day

## 2017-07-23 NOTE — Assessment & Plan Note (Signed)
echo 07/10/17: Mild concentric LVH, EF 45-50

## 2017-07-23 NOTE — Progress Notes (Signed)
07/23/2017 Charles Vance   [birth year]  [id number]  Primary Physician Patient, No Pcp Per Primary Cardiologist: Dr Allyson Sabal  HPI:  56 y.o.  married African-American male father of 2 identical twins, works at [employer],  who presented early in the morning 07/10/17 with an acute anterolateral ST segment elevation myocardial infarction. He does have a history of tobacco abuse. But had no other know medical problems. He admits to using crack cocaine earlier in the evening. He was taken emergently to the Cath Lab for an angiography and intervention. He had an occluded apical LAD and high-grade ostial first circumflex obtuse marginal branch stenosis. His EF was preserved with low anteroapical severe hypokinesia. He underwent PCI and drug-eluting stenting of his apical LAD successfully. Troponin was 20. His EF by 2-D echo was 45-50% with wall motion and LV and the distal LAD distribution. He was also diagnosed with type 2 NIDDM and HLD. He was discharged 07/11/17.  He is in the office today for post hospital follow up. He has done well since discharge, no further chest pain. He has had some loose stools (probably from Glucophage). He has not been back to work.    Current Outpatient Prescriptions  Medication Sig Dispense Refill  . aspirin EC 81 MG EC tablet Take 1 tablet (81 mg total) by mouth daily.    Marland Kitchen atorvastatin (LIPITOR) 80 MG tablet Take 1 tablet (80 mg total) by mouth daily at 6 PM. 30 tablet 11  . carvedilol (COREG) 6.25 MG tablet Take 1 tablet (6.25 mg total) by mouth 2 (two) times daily with a meal. 60 tablet 11  . lisinopril (PRINIVIL,ZESTRIL) 5 MG tablet Take 1 tablet (5 mg total) by mouth daily. 30 tablet 11  . metFORMIN (GLUCOPHAGE) 500 MG tablet Take 1 tablet (500 mg total) by mouth 2 (two) times daily with a meal. 60 tablet 2  . nitroGLYCERIN (NITROSTAT) 0.4 MG SL tablet Place 1 tablet (0.4 mg total) under the tongue every 5 (five) minutes x 3 doses as needed for chest pain. 25 tablet 11  .  ticagrelor (BRILINTA) 90 MG TABS tablet Take 1 tablet (90 mg total) by mouth 2 (two) times daily. 60 tablet 11   No current facility-administered medications for this visit.     No Known Allergies  Past Medical History:  Diagnosis Date  . CAD (coronary artery disease) 07/12/2017   Anterolateral STEMI 8/18 // LHC 07/10/17: dLAD 100 (PCI: Synergy DES), oOM1 95, pRCA 70  . Cocaine abuse 07/12/2017  . Essential hypertension 07/12/2017  . Hyperlipidemia 07/12/2017  . Ischemic cardiomyopathy 07/12/2017   Status post anterolateral STEMI 8/18 treated with DES to the distal LAD // echo 07/10/17: Mild concentric LVH, EF 45-50, hypokinesis of the apex and apical segments of the anterior, lateral, inferior myocardium, no evidence of thrombus, mild LAE  . Known health problems: none   . Tobacco abuse 07/12/2017  . Type 2 diabetes mellitus with complication (HCC) 07/12/2017    Social History   Social History  . Marital status: Single    Spouse name: N/A  . Number of children: N/A  . Years of education: N/A   Occupational History  . Not on file.   Social History Main Topics  . Smoking status: Current Every Day Smoker    Packs/day: 1.00    Years: 30.00    Types: Cigarettes  . Smokeless tobacco: Never Used  . Alcohol use 3.6 oz/week    6 Cans of beer per week  Comment: 6 pack per day  . Drug use: Yes    Frequency: 2.0 times per week    Types: Marijuana, Cocaine  . Sexual activity: Yes   Other Topics Concern  . Not on file   Social History Narrative  . No narrative on file      Review of Systems: General: negative for chills, fever, night sweats or weight changes.  Cardiovascular: negative for chest pain, dyspnea on exertion, edema, orthopnea, palpitations, paroxysmal nocturnal dyspnea or shortness of breath Dermatological: negative for rash Respiratory: negative for cough or wheezing Urologic: negative for hematuria Abdominal: negative for nausea, vomiting, diarrhea, bright red  blood per rectum, melena, or hematemesis Neurologic: negative for visual changes, syncope, or dizziness All other systems reviewed and are otherwise negative except as noted above.    Blood pressure 128/70, pulse 73, height 5' 10.5" (1.791 m), weight 208 lb (94.3 kg).  General appearance: alert, cooperative, no distress and mildly obese Neck: no carotid bruit and no JVD Lungs: clear to auscultation bilaterally Heart: regular rate and rhythm Abdomen: soft, non-tender; bowel sounds normal; no masses,  no organomegaly and umbilical hernia noted Extremities: extremities normal, atraumatic, no cyanosis or edema Skin: Skin color, texture, turgor normal. No rashes or lesions Neurologic: Grossly normal  EKG NSR, inferior and anterolateral TWI  ASSESSMENT AND PLAN:   Acute STEMI 07/10/17-(in setting of crack cocaine use)  Anterolateral STEMI 8/18 // LHC 07/10/17: dLAD 100 (PCI: Synergy DES), oOM1 95, pRCA 70  LDL 123, HDL 31  HTN-Controlled  echo 07/10/17: Mild concentric LVH, EF 45-50  PLAN  I congratulated Charles Vance on cutting down on his smoking. He should be out of work 4 weeks. I'll see him back then.   Corine Shelter PA-C 07/23/2017 2:27 PM

## 2017-07-23 NOTE — Assessment & Plan Note (Signed)
Anterolateral STEMI 8/18 // LHC 07/10/17: dLAD 100 (PCI: Synergy DES), oOM1 95, pRCA 70

## 2017-07-23 NOTE — Patient Instructions (Signed)
Medication Instructions: Your physician recommends that you continue on your current medications as directed. Please refer to the Current Medication list given to you today.  If you need a refill on your cardiac medications before your next appointment, please call your pharmacy.   Follow-Up: Your physician wants you to follow-up in: 4 weeks with Corine ShelterLuke Kilroy, PA and in 3 months with Dr. Allyson SabalBerry. You will receive a reminder letter in the mail two months in advance. If you don't receive a letter, please call our office to schedule this follow-up appointment.    Thank you for choosing [facility] at [facility]!!

## 2017-07-23 NOTE — Assessment & Plan Note (Signed)
He tells me this was a "life changing event" and he knows the consequences if he goes back to this

## 2017-07-23 NOTE — Assessment & Plan Note (Signed)
Controlled.  

## 2017-07-23 NOTE — Assessment & Plan Note (Signed)
Acute STEMI 07/10/17-(in setting of crack cocaine use)

## 2017-07-29 ENCOUNTER — Telehealth: Payer: Self-pay | Admitting: Cardiovascular Disease

## 2017-07-29 NOTE — Telephone Encounter (Signed)
Pt says the pharmacist will not fill his Atorvastatin.There is a different about his directions to take it.On his bottle it says once a  Day and on his paper it say twice a day.

## 2017-07-29 NOTE — Telephone Encounter (Signed)
Attempted to return call to patient - no answer, no VM Per chart review, patient has Rx on file for atorvastatin 80mg  once daily (this is also on discharge instructions)

## 2017-07-31 ENCOUNTER — Telehealth (HOSPITAL_COMMUNITY): Payer: Self-pay

## 2017-07-31 MED ORDER — ATORVASTATIN CALCIUM 80 MG PO TABS: 80.0000 mg | ORAL_TABLET | Freq: Every day | ORAL | 11 refills | Status: DC

## 2017-07-31 NOTE — Telephone Encounter (Signed)
Unable to reach pt or leave a message  New script sent to the pharmacy with the correct instructions

## 2017-07-31 NOTE — Telephone Encounter (Signed)
I called patient to discuss scheduling for cardiac rehab. Patient declined. Patient stated that he is doing fine at this time. I informed patient that if he changes his mind and would like to attend, then he could contact our office. Patient give office contact information. Referral closed.

## 2017-08-01 NOTE — Telephone Encounter (Signed)
S/w pharmacist she states that they received rx but pt is not due for refill until 9-16. Will await refill

## 2017-08-18 ENCOUNTER — Encounter: Payer: Self-pay | Admitting: Cardiovascular Disease

## 2017-08-18 ENCOUNTER — Ambulatory Visit (INDEPENDENT_AMBULATORY_CARE_PROVIDER_SITE_OTHER): Payer: BC Managed Care – PPO | Admitting: Cardiology

## 2017-08-18 ENCOUNTER — Encounter: Payer: Self-pay | Admitting: Cardiology

## 2017-08-18 DIAGNOSIS — I255 Ischemic cardiomyopathy: Secondary | ICD-10-CM | POA: Diagnosis not present

## 2017-08-18 DIAGNOSIS — Z9861 Coronary angioplasty status: Secondary | ICD-10-CM | POA: Diagnosis not present

## 2017-08-18 DIAGNOSIS — I10 Essential (primary) hypertension: Secondary | ICD-10-CM | POA: Diagnosis not present

## 2017-08-18 DIAGNOSIS — Z720 Tobacco use: Secondary | ICD-10-CM

## 2017-08-18 DIAGNOSIS — E785 Hyperlipidemia, unspecified: Secondary | ICD-10-CM | POA: Diagnosis not present

## 2017-08-18 DIAGNOSIS — I252 Old myocardial infarction: Secondary | ICD-10-CM

## 2017-08-18 DIAGNOSIS — E118 Type 2 diabetes mellitus with unspecified complications: Secondary | ICD-10-CM

## 2017-08-18 DIAGNOSIS — F1410 Cocaine abuse, uncomplicated: Secondary | ICD-10-CM | POA: Diagnosis not present

## 2017-08-18 DIAGNOSIS — I2510 Atherosclerotic heart disease of native coronary artery without angina pectoris: Secondary | ICD-10-CM | POA: Diagnosis not present

## 2017-08-18 MED ORDER — CARVEDILOL 12.5 MG PO TABS: 12.5000 mg | ORAL_TABLET | Freq: Two times a day (BID) | ORAL | 6 refills | Status: DC

## 2017-08-18 NOTE — Assessment & Plan Note (Signed)
Still smoking but he has cut back

## 2017-08-18 NOTE — Assessment & Plan Note (Signed)
LDL 123, HDL 31, tolerating high dose statin Rx

## 2017-08-18 NOTE — Assessment & Plan Note (Signed)
Acute STEMI 07/10/17-(in setting of crack cocaine use) 

## 2017-08-18 NOTE — Patient Instructions (Signed)
Medication Instructions: Increase Carvedilol to 12.5 mg twice daily.   Follow-Up: Keep follow-up appointment scheduled with Dr. Allyson Sabal on 10/24/17.   Any Other Special Instructions: OK to return to work next Monday with no restrictions.  HOLD Glucophage until your appointment with your primary doctor.   If you need a refill on your cardiac medications before your next appointment, please call your pharmacy.

## 2017-08-18 NOTE — Assessment & Plan Note (Signed)
Repeat BP by me  130/82

## 2017-08-18 NOTE — Assessment & Plan Note (Signed)
Anterolateral STEMI  07/10/17: dLAD 100% (PCI: with Synergy DES),  Residual OM1 95% and pRCA 70%-medical Rx

## 2017-08-18 NOTE — Progress Notes (Signed)
08/18/2017 Charles Vance   [birth year]  [id number]  Primary Physician Patient, No Pcp Per Primary Cardiologist: dr Allyson Sabal  HPI:  56 y.o.married African-American male father of 2 identical twins, works at [employer],  who presented early in the morning 07/10/17 with an acute anterolateral STEMI. He admited to using crack cocaine earlier in the evening. He was taken emergently to the cath lab for an angiography and intervention. He had an occluded apical LAD and high-grade ostial OM1 stenosis. His EF was preserved with low anteroapical severe hypokinesia. He underwent PCI and drug-eluting stenting of his apical LAD successfully. Troponin was 20. he has a residual 95% pOM1 and 70% pRCA stenosis that was not intervened on. His EF by 2-D echo was 45-50% with wall motion and LV and the distal LAD distribution. He was also diagnosed with type 2 NIDDM and HLD during that admission.   He was seen in f/u in Aug and was doing well. He is back today to be seen before returning to work. He has noticed some palpitations and brief tachycardia, usually at night. He gets up and walks around and it gores away. He has "cut way back" on his smoking. He is taking his medications as prescribed.  He denies any recurrent chest pain. He says his Rt wrist is still "sore" but he has no numbness or weakness in his Rt hand.    Current Outpatient Prescriptions  Medication Sig Dispense Refill  . aspirin EC 81 MG EC tablet Take 1 tablet (81 mg total) by mouth daily.    Marland Kitchen atorvastatin (LIPITOR) 80 MG tablet Take 1 tablet (80 mg total) by mouth daily at 6 PM. 30 tablet 11  . carvedilol (COREG) 12.5 MG tablet Take 1 tablet (12.5 mg total) by mouth 2 (two) times daily with a meal. 60 tablet 6  . lisinopril (PRINIVIL,ZESTRIL) 5 MG tablet Take 1 tablet (5 mg total) by mouth daily. 30 tablet 11  . metFORMIN (GLUCOPHAGE) 500 MG tablet Take 1 tablet (500 mg total) by mouth 2 (two) times daily with a meal. 60 tablet 2  . nitroGLYCERIN  (NITROSTAT) 0.4 MG SL tablet Place 1 tablet (0.4 mg total) under the tongue every 5 (five) minutes x 3 doses as needed for chest pain. 25 tablet 11  . ticagrelor (BRILINTA) 90 MG TABS tablet Take 1 tablet (90 mg total) by mouth 2 (two) times daily. 60 tablet 11   No current facility-administered medications for this visit.     No Known Allergies  Past Medical History:  Diagnosis Date  . CAD (coronary artery disease) 07/12/2017   Anterolateral STEMI 8/18 // LHC 07/10/17: dLAD 100 (PCI: Synergy DES), oOM1 95, pRCA 70  . Cocaine abuse 07/12/2017  . Essential hypertension 07/12/2017  . Hyperlipidemia 07/12/2017  . Ischemic cardiomyopathy 07/12/2017   Status post anterolateral STEMI 8/18 treated with DES to the distal LAD // echo 07/10/17: Mild concentric LVH, EF 45-50, hypokinesis of the apex and apical segments of the anterior, lateral, inferior myocardium, no evidence of thrombus, mild LAE  . Known health problems: none   . Tobacco abuse 07/12/2017  . Type 2 diabetes mellitus with complication (HCC) 07/12/2017    Social History   Social History  . Marital status: Single    Spouse name: N/A  . Number of children: N/A  . Years of education: N/A   Occupational History  . Not on file.   Social History Main Topics  . Smoking status: Current Every Day Smoker  Packs/day: 1.00    Years: 30.00    Types: Cigarettes  . Smokeless tobacco: Never Used  . Alcohol use 3.6 oz/week    6 Cans of beer per week     Comment: 6 pack per day  . Drug use: Yes    Frequency: 2.0 times per week    Types: Marijuana, Cocaine  . Sexual activity: Yes   Other Topics Concern  . Not on file   Social History Narrative  . No narrative on file     No family history on file.   Review of Systems: General: negative for chills, fever, night sweats or weight changes.  Cardiovascular: negative for chest pain, dyspnea on exertion, edema, orthopnea, palpitations, paroxysmal nocturnal dyspnea or shortness of  breath Dermatological: negative for rash Respiratory: negative for cough or wheezing Urologic: negative for hematuria Abdominal: negative for nausea, vomiting, diarrhea, bright red blood per rectum, melena, or hematemesis Neurologic: negative for visual changes, syncope, or dizziness All other systems reviewed and are otherwise negative except as noted above.    Blood pressure (!) 158/90, pulse 70, height  (1.778 m), weight 207 lb 12.8 oz (94.3 kg).  General appearance: alert, cooperative and no distress Neck: no carotid bruit and no JVD Lungs: clear to auscultation bilaterally Heart: regular rate and rhythm Extremities: Rt radial site without hematoma or ecchymosis Skin: Skin color, texture, turgor normal. No rashes or lesions Neurologic: Grossly normal   ASSESSMENT AND PLAN:   History of ST elevation myocardial infarction (STEMI) Acute STEMI 07/10/17-(in setting of crack cocaine use)  CAD S/P percutaneous coronary angioplasty Anterolateral STEMI  07/10/17: dLAD 100% (PCI: with Synergy DES),  Residual OM1 95% and pRCA 70%-medical Rx  Ischemic cardiomyopathy echo 07/10/17: Mild concentric LVH, EF 45-50  Dyslipidemia LDL 123, HDL 31, tolerating high dose statin Rx  Essential hypertension Repeat B/P by me 130/82  Type 2 diabetes mellitus with complication [facility]) New diagnosis Aug 2018-he has an appointment next week to get established with a PCP. He is having so,me loose stools, possibly from Glucophage. Hold Glucophage till seen by PCP  Cocaine abuse He tells me this was a "life changing event" and he knows the consequences if he goes back to this  Tobacco abuse Still smoking but he has cut back   PLAN  He is having some palpitations and his B/P is borderline high-increase Coreg to 12.5 mg BID. F/U with Dr Allyson Sabal in Nov. OK to return to work Monday Oct 1st.   Corine Shelter [place] 08/18/2017 2:59 PM

## 2017-08-18 NOTE — Assessment & Plan Note (Signed)
New diagnosis Aug 2018-he has an appointment next week to get established with a PCP. He is having so,me loose stools, possibly from Glucophage. Hold Glucophage till seen by PCP

## 2017-08-18 NOTE — Assessment & Plan Note (Signed)
echo 07/10/17: Mild concentric LVH, EF 45-50 

## 2017-08-18 NOTE — Assessment & Plan Note (Signed)
He tells me this was a "life changing event" and he knows the consequences if he goes back to this 

## 2017-10-06 ENCOUNTER — Other Ambulatory Visit: Payer: Self-pay | Admitting: Physician Assistant

## 2017-10-19 ENCOUNTER — Other Ambulatory Visit: Payer: Self-pay | Admitting: Physician Assistant

## 2017-10-24 ENCOUNTER — Ambulatory Visit: Payer: BC Managed Care – PPO | Admitting: Cardiovascular Disease

## 2017-10-29 ENCOUNTER — Encounter: Payer: Self-pay | Admitting: Cardiovascular Disease

## 2017-10-29 ENCOUNTER — Ambulatory Visit (INDEPENDENT_AMBULATORY_CARE_PROVIDER_SITE_OTHER): Payer: BC Managed Care – PPO | Admitting: Cardiovascular Disease

## 2017-10-29 VITALS — BP 132/66 | HR 74 | Ht 70.0 in | Wt 210.0 lb

## 2017-10-29 DIAGNOSIS — I252 Old myocardial infarction: Secondary | ICD-10-CM | POA: Diagnosis not present

## 2017-10-29 DIAGNOSIS — E785 Hyperlipidemia, unspecified: Secondary | ICD-10-CM

## 2017-10-29 DIAGNOSIS — Z720 Tobacco use: Secondary | ICD-10-CM | POA: Diagnosis not present

## 2017-10-29 DIAGNOSIS — I10 Essential (primary) hypertension: Secondary | ICD-10-CM

## 2017-10-29 NOTE — Assessment & Plan Note (Signed)
History of acute anterior STEMI 07/10/17 the setting of crack cocaine abuse with cardiac catheterization performed by myself revealing an apical LAD occlusion which I stented using a 2.25 x 16 mm long synergy drug-eluting stent postdilated to 2.75 mm. He did have a 95% ostial OM1 stenosis and 70% proximal dominant RCA stenosis which are treated medically. He had apical hypokinesia with an EF of 50-55%. He remains on dual antiplatelet therapy including aspirin Brilenta is relatively symptomatic. He has tried to stop smoking as noted on the 5 cigarettes a day from one to one half packs a day.

## 2017-10-29 NOTE — Patient Instructions (Signed)
Medication Instructions: Your physician recommends that you continue on your current medications as directed. Please refer to the Current Medication list given to you today.  Labwork: Your physician recommends that you return for a FASTING lipid profile and hepatic function panel at your earliest convenience.   Follow-Up: We request that you follow-up in: 6 months with an extender and in 12 months with Dr San MorelleBerry  You will receive a reminder letter in the mail two months in advance. If you don't receive a letter, please call our office to schedule the follow-up appointment.  If you need a refill on your cardiac medications before your next appointment, please call your pharmacy.

## 2017-10-29 NOTE — Assessment & Plan Note (Signed)
History of essential hypertension blood pressure measured at 158/90. He is on lisinopril and carvedilol. Continue current meds at current dosing

## 2017-10-29 NOTE — Progress Notes (Signed)
10/29/2017 Charles PurlElio C Vance   09/14/1961  [id number]  Primary Physician Patient, No Pcp Per Primary Cardiologist: Runell GessJonathan J Jerra Huckeby MD Milagros LollFACP, FACC, [place], [place]  HPI:  Charles Vance is a 56 y.o. 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 He presented the morning of 07/10/17 with acute anterolateral STEMI. Did admit to using crack came the evening before. He was taken urgently to the cath lab by myself revealing occluded distal LAD which I stented with a synergy drug-eluting stent post dilating up to 2.75 mm. He did have a 95% ostial OM1 stenosis, 70% proximal dominant RCA stenosis with preserved LV function. He had apical wall motion abnormality. Troponin rose to 20. Her problems include continued tobacco abuse trying to quit down from 1.5 packs a day over the last 40 years down the 5 cigarettes a day. History of hypertension hyperlipidemia. Since his procedure he denies chest pain.    Current Meds  Medication Sig  . aspirin EC 81 MG EC tablet Take 1 tablet (81 mg total) by mouth daily.  Marland Kitchen. atorvastatin (LIPITOR) 80 MG tablet Take 1 tablet (80 mg total) by mouth daily at 6 PM.  . carvedilol (COREG) 12.5 MG tablet Take 1 tablet (12.5 mg total) by mouth 2 (two) times daily with a meal.  . lisinopril (PRINIVIL,ZESTRIL) 5 MG tablet Take 1 tablet (5 mg total) by mouth daily.  . metFORMIN (GLUCOPHAGE) 500 MG tablet TAKE 1 TABLET (500 MG TOTAL) BY MOUTH 2 (TWO) TIMES DAILY WITH A MEAL.  . nitroGLYCERIN (NITROSTAT) 0.4 MG SL tablet Place 1 tablet (0.4 mg total) under the tongue every 5 (five) minutes x 3 doses as needed for chest pain.  . ticagrelor (BRILINTA) 90 MG TABS tablet Take 1 tablet (90 mg total) by mouth 2 (two) times daily.     No Known Allergies  Social History   Socioeconomic History  . Marital status: Single    Spouse name: Not on file  . Number of children: Not on file  . Years of education: Not on file  . Highest  education level: Not on file  Social Needs  . Financial resource strain: Not on file  . Food insecurity - worry: Not on file  . Food insecurity - inability: Not on file  . Transportation needs - medical: Not on file  . Transportation needs - non-medical: Not on file  Occupational History  . Not on file  Tobacco Use  . Smoking status: Current Every Day Smoker    Packs/day: 1.00    Years: 30.00    Pack years: 30.00    Types: Cigarettes  . Smokeless tobacco: Never Used  Substance and Sexual Activity  . Alcohol use: Yes    Alcohol/week: 3.6 oz    Types: 6 Cans of beer per week    Comment: 6 pack per day  . Drug use: Yes    Frequency: 2.0 times per week    Types: Marijuana, Cocaine  . Sexual activity: Yes  Other Topics Concern  . Not on file  Social History Narrative  . Not on file     Review of Systems: General: negative for chills, fever, night sweats or weight changes.  Cardiovascular: negative for chest pain, dyspnea on exertion, edema, orthopnea, palpitations, paroxysmal nocturnal dyspnea or shortness of breath Dermatological: negative for rash Respiratory: negative for cough or wheezing Urologic: negative for hematuria Abdominal: negative for nausea, vomiting, diarrhea, bright red blood per  rectum, melena, or hematemesis Neurologic: negative for visual changes, syncope, or dizziness All other systems reviewed and are otherwise negative except as noted above.    Blood pressure 132/66, pulse 74, height 5\' 10"  (1.778 m), weight 210 lb (95.3 kg).  General appearance: alert and no distress Neck: no adenopathy, no carotid bruit, no JVD, supple, symmetrical, trachea midline and thyroid not enlarged, symmetric, no tenderness/mass/nodules Lungs: clear to auscultation bilaterally Heart: regular rate and rhythm, S1, S2 normal, no murmur, click, rub or gallop Extremities: extremities normal, atraumatic, no cyanosis or edema Pulses: 2+ and symmetric Skin: Skin color, texture,  turgor normal. No rashes or lesions Neurologic: Alert and oriented X 3, normal strength and tone. Normal symmetric reflexes. Normal coordination and gait  EKG sinus rhythm at 74 with nonspecific ST-T wave changes. I personally reviewed this EKG.  ASSESSMENT AND PLAN:   History of ST elevation myocardial infarction (STEMI) History of acute anterior STEMI 07/10/17 the setting of crack cocaine abuse with cardiac catheterization performed by myself revealing an apical LAD occlusion which I stented using a 2.25 x 16 mm long synergy drug-eluting stent postdilated to 2.75 mm. He did have a 95% ostial OM1 stenosis and 70% proximal dominant RCA stenosis which are treated medically. He had apical hypokinesia with an EF of 50-55%. He remains on dual antiplatelet therapy including aspirin Brilenta is relatively symptomatic. He has tried to stop smoking as noted on the 5 cigarettes a day from one to one half packs a day.  Essential hypertension History of essential hypertension blood pressure measured at 158/90. He is on lisinopril and carvedilol. Continue current meds at current dosing  Dyslipidemia History of dyslipidemia high-dose statin therapy. We will recheck a lipid and liver profile  Tobacco abuse History of ongoing tobacco abuse trying to quit down from 1.5 packs per day to 5 cigarettes a day.      Runell GessJonathan J. Riyan Gavina MD FACP,FACC,FAHA, [facility] Center[facility]SCAI 10/29/2017 4:19 PM

## 2017-10-29 NOTE — Assessment & Plan Note (Addendum)
History of ongoing tobacco abuse trying to quit down from 1.5 packs per day to 5 cigarettes a day.

## 2017-10-29 NOTE — Assessment & Plan Note (Signed)
History of dyslipidemia high-dose statin therapy. We will recheck a lipid and liver profile

## 2017-12-10 ENCOUNTER — Other Ambulatory Visit: Payer: Self-pay | Admitting: Physician Assistant

## 2017-12-10 NOTE — Telephone Encounter (Signed)
This is Dr. Berry's pt 

## 2018-02-23 ENCOUNTER — Other Ambulatory Visit: Payer: Self-pay | Admitting: Physician Assistant

## 2018-03-24 ENCOUNTER — Other Ambulatory Visit: Payer: Self-pay | Admitting: Cardiology

## 2018-03-24 NOTE — Telephone Encounter (Signed)
Rx sent to pharmacy   

## 2018-05-28 ENCOUNTER — Other Ambulatory Visit: Payer: Self-pay | Admitting: Physician Assistant

## 2018-08-23 ENCOUNTER — Other Ambulatory Visit: Payer: Self-pay | Admitting: Physician Assistant

## 2018-10-17 ENCOUNTER — Other Ambulatory Visit: Payer: Self-pay | Admitting: Physician Assistant

## 2018-11-02 ENCOUNTER — Other Ambulatory Visit: Payer: Self-pay | Admitting: Physician Assistant

## 2018-11-03 NOTE — Telephone Encounter (Signed)
This is Dr. Berry's pt 

## 2018-11-16 ENCOUNTER — Other Ambulatory Visit: Payer: Self-pay | Admitting: *Deleted

## 2018-11-16 MED ORDER — LISINOPRIL 5 MG PO TABS: 5.0000 mg | ORAL_TABLET | Freq: Every day | ORAL | 11 refills | Status: DC

## 2018-11-28 ENCOUNTER — Other Ambulatory Visit (HOSPITAL_COMMUNITY): Payer: Self-pay | Admitting: Physician Assistant

## 2018-11-30 NOTE — Telephone Encounter (Signed)
This is Dr. Berry's pt 

## 2018-11-30 NOTE — Telephone Encounter (Signed)
Rx has been sent to the pharmacy electronically. ° °

## 2018-12-27 ENCOUNTER — Other Ambulatory Visit: Payer: Self-pay | Admitting: Physician Assistant

## 2019-01-29 ENCOUNTER — Other Ambulatory Visit: Payer: Self-pay | Admitting: Physician Assistant

## 2019-01-30 ENCOUNTER — Other Ambulatory Visit (HOSPITAL_COMMUNITY): Payer: Self-pay | Admitting: Physician Assistant

## 2019-02-02 ENCOUNTER — Other Ambulatory Visit: Payer: Self-pay

## 2019-02-02 NOTE — Telephone Encounter (Signed)
Opened in error

## 2019-02-02 NOTE — Telephone Encounter (Signed)
Rx(s) sent to pharmacy electronically.  

## 2019-02-21 ENCOUNTER — Other Ambulatory Visit: Payer: Self-pay | Admitting: Physician Assistant

## 2019-03-09 ENCOUNTER — Other Ambulatory Visit: Payer: Self-pay | Admitting: Physician Assistant

## 2019-03-18 ENCOUNTER — Other Ambulatory Visit: Payer: Self-pay | Admitting: Physician Assistant

## 2019-03-18 NOTE — Telephone Encounter (Signed)
Atorvastatin 80mg refilled  

## 2019-03-18 NOTE — Telephone Encounter (Signed)
Dr. Berry pt. °

## 2019-03-19 ENCOUNTER — Telehealth: Payer: Self-pay | Admitting: Cardiovascular Disease

## 2019-03-19 NOTE — Telephone Encounter (Signed)
LVM to schedule appt.  Last seen 10-29-17.

## 2019-05-11 IMAGING — DX DG CHEST 1V PORT
1 series · 1 of 1 positions shown · non-contrast
Comparison: None.

CLINICAL DATA: Code STEMI, chest pain tonight.

EXAM:
PORTABLE CHEST 1 VIEW

[chest ap]
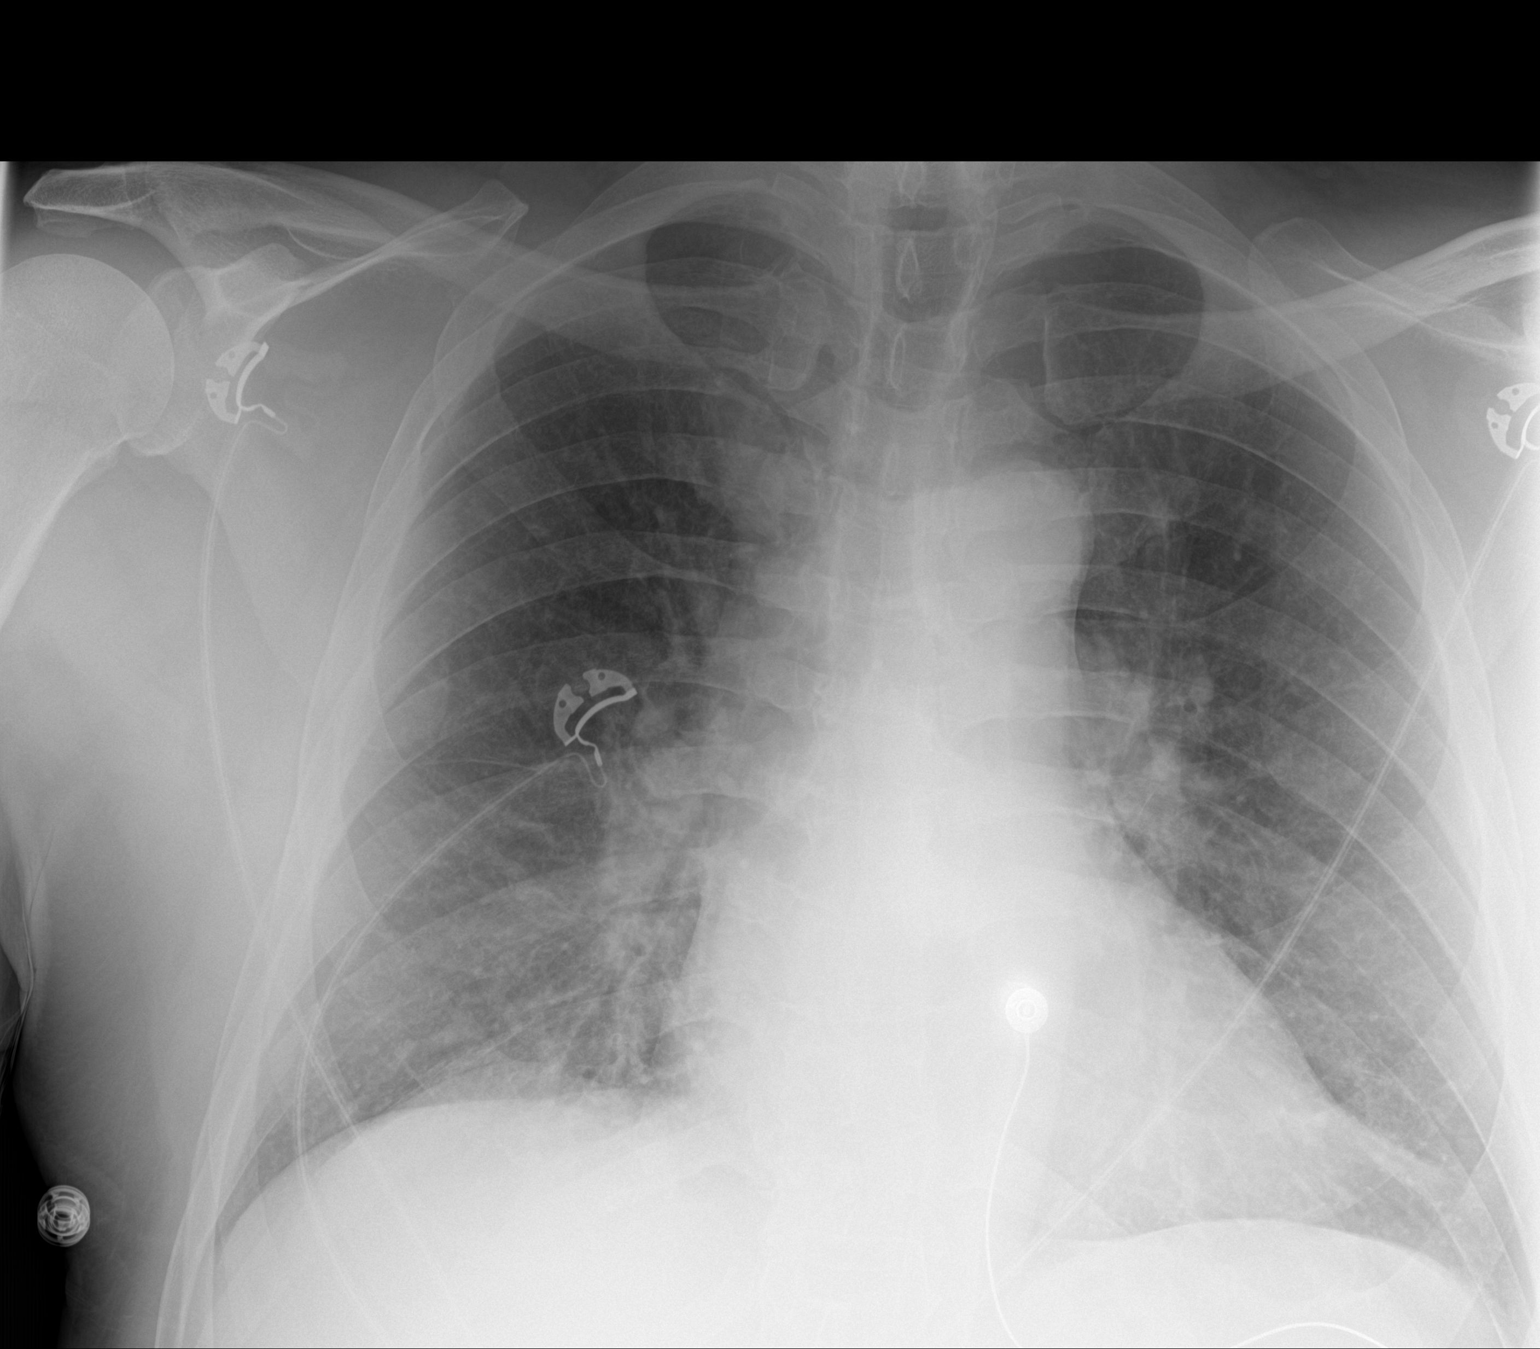

[1 of 1 positions shown; findings below may reference images not displayed]

FINDINGS: Cardiac silhouette is upper limits of normal in size. Mediastinal
silhouette is nonsuspicious. Mildly calcified aortic knob. Pulmonary
vascular congestion and mild interstitial prominence. Bandlike
density LEFT lung base. LEFT costophrenic angle incompletely imaged.
No visible pleural effusion. No pneumothorax. Soft tissue planes and
included osseous structures are nonsuspicious.
IMPRESSION: Borderline cardiomegaly. Pulmonary vascular congestion and
interstitial prominence consistent with pulmonary edema.

LEFT lung base atelectasis/scarring.

Aortic Atherosclerosis ([id number]).

## 2019-05-17 ENCOUNTER — Other Ambulatory Visit: Payer: Self-pay | Admitting: Physician Assistant

## 2019-05-18 NOTE — Telephone Encounter (Signed)
This has been refilled under Richardson Dopp every time that it has been refilled, which has been multiple times. Looks like this is a patient of Dr Gwenlyn Found and I do not see that he has ever seen [facility]. Please advise on request. Thanks, MI

## 2019-05-18 NOTE — Telephone Encounter (Signed)
It looks like I DC'd him in 2018 - he was started on it then. At that time he was asked to establish with primary care and have his diabetes managed there. He should get this medication refilled with primary care. Richardson Dopp, PA-C    05/18/2019 9:50 AM

## 2019-06-02 ENCOUNTER — Other Ambulatory Visit: Payer: Self-pay | Admitting: Cardiovascular Disease

## 2019-06-19 ENCOUNTER — Other Ambulatory Visit: Payer: Self-pay | Admitting: Cardiovascular Disease

## 2019-07-03 ENCOUNTER — Other Ambulatory Visit: Payer: Self-pay | Admitting: Cardiovascular Disease

## 2019-07-22 ENCOUNTER — Other Ambulatory Visit: Payer: Self-pay | Admitting: Cardiovascular Disease

## 2019-08-08 ENCOUNTER — Other Ambulatory Visit: Payer: Self-pay | Admitting: Cardiovascular Disease

## 2019-08-12 ENCOUNTER — Other Ambulatory Visit: Payer: Self-pay | Admitting: Cardiovascular Disease

## 2019-08-27 ENCOUNTER — Other Ambulatory Visit: Payer: Self-pay | Admitting: Cardiovascular Disease

## 2019-09-13 ENCOUNTER — Other Ambulatory Visit: Payer: Self-pay

## 2019-09-15 ENCOUNTER — Other Ambulatory Visit: Payer: Self-pay

## 2020-02-03 ENCOUNTER — Ambulatory Visit: Payer: BC Managed Care – PPO | Attending: Family

## 2020-02-03 DIAGNOSIS — Z23 Encounter for immunization: Secondary | ICD-10-CM

## 2020-02-03 NOTE — Progress Notes (Signed)
   Covid-19 Vaccination Clinic  Name:  Charles Vance    MRN: [id number] DOB: 29-Sep-1961  02/03/2020  Charles Vance was observed post Covid-19 immunization for 15 minutes without incident. He was provided with Vaccine Information Sheet and instruction to access the V-Safe system.   Charles Vance was instructed to call 911 with any severe reactions post vaccine: Marland Kitchen Difficulty breathing  . Swelling of face and throat  . A fast heartbeat  . A bad rash all over body  . Dizziness and weakness   Immunizations Administered    Name Date Dose VIS Date Route   Moderna COVID-19 Vaccine 02/03/2020 11:31 AM 0.5 mL 10/26/2019 Intramuscular   Manufacturer: Moderna   Lot: [id number]   NDC: 00712-197-58

## 2020-03-01 ENCOUNTER — Other Ambulatory Visit: Payer: Self-pay | Admitting: Cardiovascular Disease

## 2020-03-01 ENCOUNTER — Other Ambulatory Visit: Payer: Self-pay | Admitting: Physician Assistant

## 2020-03-02 NOTE — Telephone Encounter (Signed)
This is Dr. Berry's pt 

## 2020-03-07 ENCOUNTER — Ambulatory Visit: Payer: BC Managed Care – PPO | Attending: Family

## 2020-03-07 DIAGNOSIS — Z23 Encounter for immunization: Secondary | ICD-10-CM

## 2020-03-07 NOTE — Progress Notes (Signed)
   Covid-19 Vaccination Clinic  Name:  JACQUAN SAVAS    MRN: [id number] DOB: Jan 17, 1961  03/07/2020  Mr. Whittinghill was observed post Covid-19 immunization for 15 minutes without incident. He was provided with Vaccine Information Sheet and instruction to access the V-Safe system.   Mr. Saefong was instructed to call 911 with any severe reactions post vaccine: Marland Kitchen Difficulty breathing  . Swelling of face and throat  . A fast heartbeat  . A bad rash all over body  . Dizziness and weakness   Immunizations Administered    Name Date Dose VIS Date Route   Moderna COVID-19 Vaccine 03/07/2020  2:37 PM 0.5 mL 10/26/2019 Intramuscular   Manufacturer: Moderna   Lot: [id number]   NDC: 56720-919-80

## 2020-03-12 ENCOUNTER — Other Ambulatory Visit: Payer: Self-pay | Admitting: Cardiovascular Disease

## 2020-04-03 ENCOUNTER — Other Ambulatory Visit: Payer: Self-pay | Admitting: Cardiovascular Disease

## 2020-04-05 ENCOUNTER — Other Ambulatory Visit: Payer: Self-pay | Admitting: Cardiovascular Disease

## 2020-04-27 ENCOUNTER — Other Ambulatory Visit: Payer: Self-pay | Admitting: Cardiovascular Disease

## 2020-07-03 ENCOUNTER — Other Ambulatory Visit: Payer: Self-pay | Admitting: Cardiovascular Disease

## 2020-07-03 NOTE — Telephone Encounter (Signed)
Placed call to pt to verify medications that need to be refilled due to 2 different strengths of Carvedilol been requested. Pt was at work and stated that he gets off at 3:30 and will call with his bottles to verify what he needs refilled.

## 2020-07-03 NOTE — Telephone Encounter (Signed)
*  STAT* If patient is at the pharmacy, call can be transferred to refill team.   1. Which medications need to be refilled? (please list name of each medication and dose if known)  atorvastatin (LIPITOR) 80 MG tablet carvedilol (COREG) 12.5 MG tablet carvedilol (COREG) 6.25 MG tablet lisinopril (ZESTRIL) 5 MG tablet ticagrelor (BRILINTA) 90 MG TABS tablet  2. Which pharmacy/location (including street and city if local pharmacy) is medication to be sent to? [id number] - [place], [place] - [address]  3. Do they need a 30 day or 90 day supply? 90 day Patient only has two pills left of his meds.

## 2020-07-10 ENCOUNTER — Other Ambulatory Visit: Payer: Self-pay | Admitting: Cardiovascular Disease

## 2020-07-10 MED ORDER — CARVEDILOL 6.25 MG PO TABS: 6.2500 mg | ORAL_TABLET | Freq: Two times a day (BID) | ORAL | 0 refills | Status: DC

## 2020-07-10 NOTE — Telephone Encounter (Signed)
*  STAT* If patient is at the pharmacy, call can be transferred to refill team.   1. Which medications need to be refilled? (please list name of each medication and dose if known) carvedilol (COREG) 6.25 MG tablet  2. Which pharmacy/location (including street and city if local pharmacy) is medication to be sent to? [id number] - [place], [place] - [address]  3. Do they need a 30 day or 90 day supply? 30 day

## 2020-07-10 NOTE — Telephone Encounter (Signed)
Rx(s) sent to pharmacy electronically. Appointment 08/08/20 with Dr. Allyson Sabal

## 2020-07-26 ENCOUNTER — Other Ambulatory Visit: Payer: Self-pay

## 2020-08-01 ENCOUNTER — Other Ambulatory Visit: Payer: Self-pay | Admitting: Cardiovascular Disease

## 2020-08-02 ENCOUNTER — Other Ambulatory Visit: Payer: Self-pay | Admitting: Cardiovascular Disease

## 2020-08-08 ENCOUNTER — Encounter: Payer: Self-pay | Admitting: Cardiovascular Disease

## 2020-08-08 ENCOUNTER — Ambulatory Visit (INDEPENDENT_AMBULATORY_CARE_PROVIDER_SITE_OTHER): Payer: BC Managed Care – PPO | Admitting: Cardiovascular Disease

## 2020-08-08 ENCOUNTER — Other Ambulatory Visit: Payer: Self-pay

## 2020-08-08 VITALS — BP 144/94 | HR 69 | Ht 70.0 in | Wt 196.4 lb

## 2020-08-08 DIAGNOSIS — I255 Ischemic cardiomyopathy: Secondary | ICD-10-CM | POA: Diagnosis not present

## 2020-08-08 DIAGNOSIS — I10 Essential (primary) hypertension: Secondary | ICD-10-CM

## 2020-08-08 DIAGNOSIS — Z720 Tobacco use: Secondary | ICD-10-CM

## 2020-08-08 DIAGNOSIS — F1410 Cocaine abuse, uncomplicated: Secondary | ICD-10-CM

## 2020-08-08 DIAGNOSIS — I252 Old myocardial infarction: Secondary | ICD-10-CM

## 2020-08-08 DIAGNOSIS — E785 Hyperlipidemia, unspecified: Secondary | ICD-10-CM

## 2020-08-08 NOTE — Assessment & Plan Note (Signed)
History of anterolateral STEMI 07/10/2017.  This occurred after using cocaine the evening before.  I took him urgently to the Cath Lab where he had an occluded distal LAD that I stented with a synergy stent postdilated 2.75 mm.  He also 95% ostial OM1 stenosis and a 70% proximal dominant RCA with preserved LV function.  He had an apical wall motion abnormality.  He denies chest pain or shortness of breath.  He is remained on dual antiplatelet therapy.  I think we can stop his Brilinta and continue with low-dose aspirin.

## 2020-08-08 NOTE — Assessment & Plan Note (Signed)
He has not used cocaine since his index event.

## 2020-08-08 NOTE — Assessment & Plan Note (Signed)
History of ischemic cardiomyopathy with an EF of 45 to 50% by 2D echo performed 05/10/2017.  He is on appropriate medications.  He is asymptomatic.

## 2020-08-08 NOTE — Assessment & Plan Note (Signed)
History of essential hypertension blood pressure measured today 144/94.  He is on carvedilol and lisinopril.

## 2020-08-08 NOTE — Progress Notes (Signed)
08/08/2020 Charles Vance   1961-11-07  [id number]  Primary Physician Patient, No Pcp Per Primary Cardiologist: Runell Gess MD Milagros Loll, [place], [place]  HPI:  Charles Vance is a 59 y.o.  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  I last saw him in the office 10/29/2017.  He presented the morning of 07/10/17 with acute anterolateral STEMI. Did admit to using crack came the evening before. He was taken urgently to the cath lab by myself revealing occluded distal LAD which I stented with a synergy drug-eluting stent post dilating up to 2.75 mm. He did have a 95% ostial OM1 stenosis, 70% proximal dominant RCA stenosis with preserved LV function. He had apical wall motion abnormality. Troponin rose to 20. Her problems include continued tobacco abuse trying to quit down from 1.5 packs a day over the last 40 years down the 5 cigarettes a day. History of hypertension hyperlipidemia.   Since I saw him 3 years ago he continues to do well.  Unfortunately, he still smokes a pack a day.  He denies chest pain or shortness of breath.  He is remained on dual antiplatelet therapy including aspirin and Brilinta.   Current Meds  Medication Sig  . aspirin EC 81 MG EC tablet Take 1 tablet (81 mg total) by mouth daily.  Marland Kitchen atorvastatin (LIPITOR) 80 MG tablet TAKE 1 TABLET (80 MG TOTAL) BY MOUTH DAILY AT 6 PM. OFFICE VISIT NEEDED  . carvedilol (COREG) 6.25 MG tablet Take 1 tablet (6.25 mg total) by mouth 2 (two) times daily with a meal.  . lisinopril (ZESTRIL) 5 MG tablet TAKE 1 TAB BY MOUTH DAILY. PLEASE SCHEDULE ANNUAL APPT WITH DR. Allyson Sabal FOR REFILLS. [phone]  . nitroGLYCERIN (NITROSTAT) 0.4 MG SL tablet Place 1 tablet (0.4 mg total) under the tongue every 5 (five) minutes x 3 doses as needed for chest pain. (Patient taking differently: Place 0.4 mg under the tongue every 5 (five) minutes x 3 doses as needed for chest pain. Patient states that  he has it but he's never had to take it.)  . [DISCONTINUED] BRILINTA 90 MG TABS tablet TAKE 1 TABLET (90 MG TOTAL) BY MOUTH 2 (TWO) TIMES DAILY.     No Known Allergies  Social History   Socioeconomic History  . Marital status: Single    Spouse name: Not on file  . Number of children: Not on file  . Years of education: Not on file  . Highest education level: Not on file  Occupational History  . Not on file  Tobacco Use  . Smoking status: Current Every Day Smoker    Packs/day: 1.00    Years: 30.00    Pack years: 30.00    Types: Cigarettes  . Smokeless tobacco: Never Used  Substance and Sexual Activity  . Alcohol use: Yes    Alcohol/week: 6.0 standard drinks    Types: 6 Cans of beer per week    Comment: 6 pack per day  . Drug use: Yes    Frequency: 2.0 times per week    Types: Marijuana, Cocaine  . Sexual activity: Yes  Other Topics Concern  . Not on file  Social History Narrative  . Not on file   Social Determinants of Health   Financial Resource Strain:   . Difficulty of Paying Living Expenses: Not on file  Food Insecurity:   . Worried About Programme researcher, broadcasting/film/video in the Last  Year: Not on file  . Ran Out of Food in the Last Year: Not on file  Transportation Needs:   . Lack of Transportation (Medical): Not on file  . Lack of Transportation (Non-Medical): Not on file  Physical Activity:   . Days of Exercise per Week: Not on file  . Minutes of Exercise per Session: Not on file  Stress:   . Feeling of Stress : Not on file  Social Connections:   . Frequency of Communication with Friends and Family: Not on file  . Frequency of Social Gatherings with Friends and Family: Not on file  . Attends Religious Services: Not on file  . Active Member of Clubs or Organizations: Not on file  . Attends Banker Meetings: Not on file  . Marital Status: Not on file  Intimate Partner Violence:   . Fear of Current or Ex-Partner: Not on file  . Emotionally Abused: Not on  file  . Physically Abused: Not on file  . Sexually Abused: Not on file     Review of Systems: General: negative for chills, fever, night sweats or weight changes.  Cardiovascular: negative for chest pain, dyspnea on exertion, edema, orthopnea, palpitations, paroxysmal nocturnal dyspnea or shortness of breath Dermatological: negative for rash Respiratory: negative for cough or wheezing Urologic: negative for hematuria Abdominal: negative for nausea, vomiting, diarrhea, bright red blood per rectum, melena, or hematemesis Neurologic: negative for visual changes, syncope, or dizziness All other systems reviewed and are otherwise negative except as noted above.    Blood pressure (!) 144/94, pulse 69, height 5\' 10"  (1.778 m), weight 196 lb 6.4 oz (89.1 kg), SpO2 97 %.  General appearance: alert and no distress Neck: no adenopathy, no carotid bruit, no JVD, supple, symmetrical, trachea midline and thyroid not enlarged, symmetric, no tenderness/mass/nodules Lungs: clear to auscultation bilaterally Heart: regular rate and rhythm Extremities: extremities normal, atraumatic, no cyanosis or edema Pulses: 2+ and symmetric Skin: Skin color, texture, turgor normal. No rashes or lesions Neurologic: Alert and oriented X 3, normal strength and tone. Normal symmetric reflexes. Normal coordination and gait  EKG sinus rhythm 69 with nonspecific ST and T wave changes.  Personally reviewed this EKG.  ASSESSMENT AND PLAN:   History of ST elevation myocardial infarction (STEMI) History of anterolateral STEMI 07/10/2017.  This occurred after using cocaine the evening before.  I took him urgently to the Cath Lab where he had an occluded distal LAD that I stented with a synergy stent postdilated 2.75 mm.  He also 95% ostial OM1 stenosis and a 70% proximal dominant RCA with preserved LV function.  He had an apical wall motion abnormality.  He denies chest pain or shortness of breath.  He is remained on dual  antiplatelet therapy.  I think we can stop his Brilinta and continue with low-dose aspirin.  Ischemic cardiomyopathy History of ischemic cardiomyopathy with an EF of 45 to 50% by 2D echo performed 05/10/2017.  He is on appropriate medications.  He is asymptomatic.  Essential hypertension History of essential hypertension blood pressure measured today 144/94.  He is on carvedilol and lisinopril.  Dyslipidemia History of dyslipidemia on statin therapy.  His last lipid profile performed 05/10/2017 revealed total cholesterol 214, LDL 123 and HDL 31.  He is not had a lipid profile since.  Going order is fasting lipid liver profile.  Tobacco abuse Continue tobacco abuse 1 pack/day recalcitrant to risk factor modification.  Cocaine abuse He has not used cocaine since his index event.  Runell Gess MD FACP,FACC,FAHA, [facility] 08/08/2020 4:41 PM

## 2020-08-08 NOTE — Assessment & Plan Note (Signed)
Continue tobacco abuse 1 pack/day recalcitrant to risk factor modification. 

## 2020-08-08 NOTE — Patient Instructions (Signed)
Medication Instructions:  STOP Brilinta START Aspirin 81 mg daily  *If you need a refill on your cardiac medications before your next appointment, please call your pharmacy*   Lab Work: Please return for FASTING labs in 1-2 weeks  (Lipid, hepatic)  Our in office lab hours are Monday-Friday 8:00-4:00, closed for lunch 12:45-1:45 pm.  No appointment needed.  Follow-Up: At [facility], you and your health needs are our priority.  As part of our continuing mission to provide you with exceptional heart care, we have created designated Provider Care Teams.  These Care Teams include your primary Cardiologist (physician) and Advanced Practice Providers (APPs -  Physician Assistants and Nurse Practitioners) who all work together to provide you with the care you need, when you need it.  We recommend signing up for the patient portal called "MyChart".  Sign up information is provided on this After Visit Summary.  MyChart is used to connect with patients for Virtual Visits (Telemedicine).  Patients are able to view lab/test results, encounter notes, upcoming appointments, etc.  Non-urgent messages can be sent to your provider as well.   To learn more about what you can do with MyChart, go to [email or web address].    Your next appointment:   12 month(s)  The format for your next appointment:   In Person  Provider:   Nanetta Batty, MD

## 2020-08-08 NOTE — Assessment & Plan Note (Signed)
History of dyslipidemia on statin therapy.  His last lipid profile performed 05/10/2017 revealed total cholesterol 214, LDL 123 and HDL 31.  He is not had a lipid profile since.  Going order is fasting lipid liver profile.

## 2020-08-10 ENCOUNTER — Other Ambulatory Visit: Payer: Self-pay | Admitting: Cardiovascular Disease

## 2020-08-28 LAB — HEPATIC FUNCTION PANEL
ALT: 11 IU/L (ref 0–44)
AST: 11 IU/L (ref 0–40)
Albumin: 4.2 g/dL (ref 3.8–4.9)
Alkaline Phosphatase: 108 IU/L (ref 44–121)
Bilirubin Total: 0.3 mg/dL (ref 0.0–1.2)
Bilirubin, Direct: <0.1 mg/dL (ref 0.00–0.40)
Total Protein: 6.8 g/dL (ref 6.0–8.5)

## 2020-08-28 LAB — LIPID PANEL
Chol/HDL Ratio: 4.8 ratio (ref 0.0–5.0)
Cholesterol, Total: 159 mg/dL (ref 100–199)
HDL: 33 mg/dL — ABNORMAL LOW (ref >39)
LDL Chol Calc (NIH): 108 mg/dL — ABNORMAL HIGH (ref 0–99)
Triglycerides: 97 mg/dL (ref 0–149)
VLDL Cholesterol Cal: 18 mg/dL (ref 5–40)

## 2020-10-03 ENCOUNTER — Ambulatory Visit: Payer: BC Managed Care – PPO

## 2020-10-03 NOTE — Progress Notes (Deleted)
Patient ID: QUANDARIUS NILL                 DOB: May 07, 1961                    MRN: [id number]     HPI: Charles Vance is a 59 y.o. male patient referred to lipid clinic by Dr Allyson Sabal. PMH is significant for hypertension, DM, cocaine abuse, dyslipidemia, cardiomyopathy, CAD s/p STEMI and angioplasty.  Current Medications: atorvastatin 80mg  daily  Intolerances: none  LDL goal: 70mg /dL  Diet:   Exercise:   Family History:   Social History:current smoker,  6 cans of beers per day?? marijuana, cocaine?  Labs:  Past Medical History:  Diagnosis Date  . CAD (coronary artery disease) 07/12/2017   Anterolateral STEMI 8/18 // LHC 07/10/17: dLAD 100 (PCI: Synergy DES), oOM1 95, pRCA 70  . Cocaine abuse (HCC) 07/12/2017  . Essential hypertension 07/12/2017  . Hyperlipidemia 07/12/2017  . Ischemic cardiomyopathy 07/12/2017   Status post anterolateral STEMI 8/18 treated with DES to the distal LAD // echo 07/10/17: Mild concentric LVH, EF 45-50, hypokinesis of the apex and apical segments of the anterior, lateral, inferior myocardium, no evidence of thrombus, mild LAE  . Known health problems: none   . Tobacco abuse 07/12/2017  . Type 2 diabetes mellitus with complication (HCC) 07/12/2017    Current Outpatient Medications on File Prior to Visit  Medication Sig Dispense Refill  . aspirin EC 81 MG EC tablet Take 1 tablet (81 mg total) by mouth daily.    07/14/2017 atorvastatin (LIPITOR) 80 MG tablet TAKE 1 TABLET (80 MG TOTAL) BY MOUTH DAILY AT 6 PM. OFFICE VISIT NEEDED 15 tablet 0  . carvedilol (COREG) 6.25 MG tablet Take 1 tablet (6.25 mg total) by mouth 2 (two) times daily with a meal. 60 tablet 0  . lisinopril (ZESTRIL) 5 MG tablet TAKE 1 TAB BY MOUTH DAILY. PLEASE SCHEDULE ANNUAL APPT WITH DR. Marland Kitchen FOR REFILLS. [phone] 30 tablet 11  . nitroGLYCERIN (NITROSTAT) 0.4 MG SL tablet Place 1 tablet (0.4 mg total) under the tongue every 5 (five) minutes x 3 doses as needed for chest pain. (Patient taking  differently: Place 0.4 mg under the tongue every 5 (five) minutes x 3 doses as needed for chest pain. Patient states that he has it but he's never had to take it.) 25 tablet 11   No current facility-administered medications on file prior to visit.    No Known Allergies  No problem-specific Assessment & Plan notes found for this encounter.    Malarie Tappen Rodriguez-Guzman PharmD, BCPS, CPP [facility] [address] [place] 10/03/2020 9:45 AM

## 2020-10-16 ENCOUNTER — Other Ambulatory Visit: Payer: Self-pay | Admitting: *Deleted

## 2020-10-16 MED ORDER — CARVEDILOL 6.25 MG PO TABS: 6.2500 mg | ORAL_TABLET | Freq: Two times a day (BID) | ORAL | 0 refills | Status: DC

## 2020-11-16 ENCOUNTER — Other Ambulatory Visit: Payer: Self-pay | Admitting: Cardiovascular Disease

## 2020-11-23 ENCOUNTER — Ambulatory Visit (INDEPENDENT_AMBULATORY_CARE_PROVIDER_SITE_OTHER): Payer: BC Managed Care – PPO | Admitting: Pharmacist Clinician (PhC)/ Clinical Pharmacy Specialist

## 2020-11-23 ENCOUNTER — Other Ambulatory Visit: Payer: Self-pay

## 2020-11-23 DIAGNOSIS — E785 Hyperlipidemia, unspecified: Secondary | ICD-10-CM

## 2020-11-23 NOTE — Patient Instructions (Signed)
Your Results:             Your most recent labs Goal  Total Cholesterol 159 < 200  Triglycerides 97 < 150  HDL (happy/good cholesterol) 33 > 40  LDL (lousy/bad cholesterol 108 < 70      Medication changes:   We will start paperwork to get Repatha covered by your insurance.  Once covered Charles Vance will let you know to pick up at your pharmacy.    Lab orders:  Repeat cholesterol labs after 4-5 doses.  We will mail a lab order to you in February.     Thank you for choosing [facility]

## 2020-11-23 NOTE — Progress Notes (Signed)
11/23/2020 Charles Vance 1961/04/08 [id number]   HPI:  Charles Vance is a 59 y.o. male patient of Dr Allyson Sabal, who presents today for a lipid clinic evaluation.  See pertinent past medical history below.  He was last seen by Dr. Allyson Sabal in September and was doing well other than he continues to smoke.  Brilinta was stopped and he was to remain on 81 mg aspirin.  Lipid labs were ordered at that time.  LDL came back at 108 despite patient taking atorvastatin 80 mg daily  Patient in office today for lipid consult.  States compliance with medications, has not had any issues with atorvastatin.    Past Medical History: ASCVD STEMI 8/18 occluded distal LAD stented, 95% ostial OM1 stenosis, 70% proximal dominant RCA - on aspirin, atorvastatin, lisinopril and carvedilol  Ischemic cardiomyopathy EF 45-50% 04/2017 - on lisinopril, carvedilol  hypertension On carvedilol and lisinopril  DM2 Last A1c in system 2018 at 9.5 - currently on no medications  Tobacco abuse Current    Current Medications: atorvastatin 80 mg qd  Cholesterol Goals: LDL < 70   Intolerant/previously tried: none, tolerating atorvastatin 80  Family history: brother with DM, had stroke at 56, still living; mother died last week at 63 cancer, father living at 21; children healthy  Diet: mix of home/eating out some fast food. 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 aspirin EC 81 MG EC tablet Take 1 tablet (81 mg total) by mouth daily.    Marland Kitchen atorvastatin (LIPITOR) 80 MG tablet TAKE 1 TABLET (80 MG TOTAL) BY MOUTH DAILY AT 6 PM. OFFICE VISIT NEEDED 15 tablet 0  . carvedilol (COREG) 6.25 MG tablet TAKE 1 TABLET BY MOUTH 2 TIMES DAILY WITH A MEAL. 60 tablet 0  . lisinopril (ZESTRIL) 5 MG tablet TAKE 1 TAB BY  MOUTH DAILY. PLEASE SCHEDULE ANNUAL APPT WITH DR. Allyson Sabal FOR REFILLS. [phone] 30 tablet 11  . nitroGLYCERIN (NITROSTAT) 0.4 MG SL tablet Place 1 tablet (0.4 mg total) under the tongue every 5 (five) minutes x 3 doses as needed for chest pain. (Patient taking differently: Place 0.4 mg under the tongue every 5 (five) minutes x 3 doses as needed for chest pain. Patient states that he has it but he's never had to take it.) 25 tablet 11   No current facility-administered medications for this visit.    No Known Allergies  Past Medical History:  Diagnosis Date  . CAD (coronary artery disease) 07/12/2017   Anterolateral STEMI 8/18 // LHC 07/10/17: dLAD 100 (PCI: Synergy DES), oOM1 95, pRCA 70  . Cocaine abuse (HCC) 07/12/2017  . Essential hypertension 07/12/2017  . Hyperlipidemia 07/12/2017  . Ischemic cardiomyopathy 07/12/2017   Status post anterolateral STEMI 8/18 treated with DES to the distal LAD // echo 07/10/17: Mild concentric LVH, EF 45-50, hypokinesis of the apex and apical segments of the anterior, lateral, inferior myocardium, no evidence of thrombus, mild LAE  . Known health problems: none   . Tobacco abuse 07/12/2017  . Type 2 diabetes mellitus with complication (HCC) 07/12/2017    There were no vitals taken for this visit.   Dyslipidemia Patient with elevated LDL and history of ASCVD.  Currently taking atorvastatin 80 mg daily without problem.  Reviewed options for lowering LDL  cholesterol, including PCSK-9 inhibitors and bempedoic acid.  Discussed mechanisms of action, dosing, side effects and potential decreases in LDL cholesterol.  Answered all patient questions.  Based on this information, patient would prefer to start PCSK-9 inhibitor.  Per insurance his preferred agent would be Repatha.  Because of some hesitancy we did have him do a sample dose in the office today.  He did this without hesitation or issue.  Once approved by his insurance, we will repeat lipid labs after 4-5 doses.      Phillips Hay PharmD CPP [facility] [address] [place], [place] [address] [phone]

## 2020-11-23 NOTE — Assessment & Plan Note (Signed)
Patient with elevated LDL and history of ASCVD.  Currently taking atorvastatin 80 mg daily without problem.  Reviewed options for lowering LDL cholesterol, including PCSK-9 inhibitors and bempedoic acid.  Discussed mechanisms of action, dosing, side effects and potential decreases in LDL cholesterol.  Answered all patient questions.  Based on this information, patient would prefer to start PCSK-9 inhibitor.  Per insurance his preferred agent would be Repatha.  Because of some hesitancy we did have him do a sample dose in the office today.  He did this without hesitation or issue.  Once approved by his insurance, we will repeat lipid labs after 4-5 doses.

## 2020-12-01 ENCOUNTER — Ambulatory Visit: Payer: BC Managed Care – PPO | Attending: Family

## 2020-12-01 DIAGNOSIS — Z23 Encounter for immunization: Secondary | ICD-10-CM

## 2020-12-08 ENCOUNTER — Other Ambulatory Visit: Payer: Self-pay | Admitting: Cardiovascular Disease

## 2020-12-18 ENCOUNTER — Other Ambulatory Visit: Payer: Self-pay | Admitting: Cardiovascular Disease

## 2020-12-20 ENCOUNTER — Telehealth: Payer: Self-pay | Admitting: Cardiovascular Disease

## 2020-12-20 MED ORDER — PRALUENT 150 MG/ML ~~LOC~~ SOAJ: 150.0000 mg | SUBCUTANEOUS | 11 refills | Status: AC

## 2020-12-20 NOTE — Telephone Encounter (Signed)
Follow up:     Patient calling to check the status of his reifill.

## 2020-12-20 NOTE — Telephone Encounter (Signed)
Processing a pa

## 2020-12-20 NOTE — Telephone Encounter (Signed)
Unable to lmom mailbox full... pt was supposed to be started on praluent 150mg , rx sent to pharmacy on file. The pa for praluent was approved

## 2020-12-20 NOTE — Addendum Note (Signed)
Addended by: Eather Colas on: 12/20/2020 05:49 PM   Modules accepted: Orders

## 2020-12-20 NOTE — Telephone Encounter (Signed)
*  STAT* If patient is at the pharmacy, call can be transferred to refill team.   1. Which medications need to be refilled? (please list name of each medication and dose if known) repatha  2. Which pharmacy/location (including street and city if local pharmacy) is medication to be sent to? [facility]  3. Do they need a 30 day or 90 day supply? Not sure

## 2020-12-21 NOTE — Telephone Encounter (Signed)
Was told the pt called in and told the operator he knows he can get repatha at $5 month. I called and lmomed the pt that his insurance will only cover praluent and that they do offer a copay card as well  and we would be happy to help. In order to do repatha pt has to try and fail praluent.

## 2021-01-11 ENCOUNTER — Other Ambulatory Visit: Payer: Self-pay | Admitting: Cardiovascular Disease

## 2021-03-28 NOTE — Progress Notes (Signed)
   Covid-19 Vaccination Clinic  Name:  Charles Vance    MRN: [id number] DOB: 1961/05/13  03/28/2021  Mr. [name] was observed post Covid-19 immunization for 15 minutes without incident. He was provided with Vaccine Information Sheet and instruction to access the V-Safe system.   Mr. [name] was instructed to call 911 with any severe reactions post vaccine: Marland Kitchen Difficulty breathing  . Swelling of face and throat  . A fast heartbeat  . A bad rash all over body  . Dizziness and weakness   Immunizations Administered    Name Date Dose VIS Date Route   Moderna Covid-19 Booster Vaccine 12/01/2020 11:45 AM 0.25 mL 09/13/2020 Intramuscular   Manufacturer: Moderna   Lot: [id number]   NDC: 70623-762-83

## 2021-10-21 ENCOUNTER — Other Ambulatory Visit: Payer: Self-pay | Admitting: Cardiovascular Disease

## 2021-11-19 ENCOUNTER — Other Ambulatory Visit: Payer: Self-pay | Admitting: Cardiovascular Disease

## 2021-11-21 ENCOUNTER — Telehealth: Payer: Self-pay

## 2021-11-21 NOTE — Telephone Encounter (Signed)
Mailed a letter telling the pt that we need labs

## 2021-12-04 ENCOUNTER — Other Ambulatory Visit: Payer: Self-pay | Admitting: Cardiovascular Disease

## 2021-12-27 ENCOUNTER — Telehealth: Payer: Self-pay

## 2021-12-27 NOTE — Telephone Encounter (Signed)
Called and lmom pt that lipid labs are needed

## 2022-02-12 ENCOUNTER — Other Ambulatory Visit: Payer: Self-pay

## 2022-02-12 DIAGNOSIS — E785 Hyperlipidemia, unspecified: Secondary | ICD-10-CM

## 2022-04-17 ENCOUNTER — Encounter: Payer: Self-pay | Admitting: Cardiovascular Disease

## 2022-04-17 ENCOUNTER — Ambulatory Visit (INDEPENDENT_AMBULATORY_CARE_PROVIDER_SITE_OTHER): Payer: Self-pay | Admitting: Cardiovascular Disease

## 2022-04-17 DIAGNOSIS — I10 Essential (primary) hypertension: Secondary | ICD-10-CM

## 2022-04-17 DIAGNOSIS — Z720 Tobacco use: Secondary | ICD-10-CM

## 2022-04-17 DIAGNOSIS — E785 Hyperlipidemia, unspecified: Secondary | ICD-10-CM

## 2022-04-17 DIAGNOSIS — I255 Ischemic cardiomyopathy: Secondary | ICD-10-CM

## 2022-04-17 DIAGNOSIS — I252 Old myocardial infarction: Secondary | ICD-10-CM

## 2022-04-17 NOTE — Progress Notes (Signed)
04/17/2022 Charles Vance   1961/07/16  [id number]  Primary Physician Patient, No Pcp Per (Inactive) Primary Cardiologist: Runell Gess MD Milagros Loll, [place], [place]  HPI:  Charles Vance is a 61 y.o.  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  I last saw him in the office 08/08/2020.  He presented the morning of 07/10/17 with acute anterolateral STEMI. Did admit to using crack came the evening before. He was taken urgently to the cath lab by myself revealing occluded distal LAD which I stented with a synergy drug-eluting stent post dilating up to 2.75 mm. He did have a 95% ostial OM1 stenosis, 70% proximal dominant RCA stenosis with preserved LV function. He had apical wall motion abnormality. Troponin rose to 20. Her problems include continued tobacco abuse trying to quit down from 1.5 packs a day over the last 40 years down the 5 cigarettes a day. History of hypertension hyperlipidemia.    Since I saw him 2 years ago he continues to do well.  He still continues to smoke 1/2 pack/day.  He denies chest pain or shortness of breath.   Current Meds  Medication Sig   aspirin EC 81 MG EC tablet Take 1 tablet (81 mg total) by mouth daily.   atorvastatin (LIPITOR) 80 MG tablet TAKE 1 TABLET (80 MG TOTAL) BY MOUTH DAILY AT 6 PM. OFFICE VISIT NEEDED   carvedilol (COREG) 6.25 MG tablet TAKE 1 TABLET BY MOUTH 2 TIMES DAILY WITH A MEAL.   lisinopril (ZESTRIL) 5 MG tablet TAKE 1 TABLET (5 MG TOTAL) BY MOUTH DAILY.   nitroGLYCERIN (NITROSTAT) 0.4 MG SL tablet Place 1 tablet (0.4 mg total) under the tongue every 5 (five) minutes x 3 doses as needed for chest pain. (Patient taking differently: Place 0.4 mg under the tongue every 5 (five) minutes x 3 doses as needed for chest pain. Patient states that he has it but he's never had to take it.)     No Known Allergies  Social History   Socioeconomic History   Marital status: Single    Spouse  name: Not on file   Number of children: Not on file   Years of education: Not on file   Highest education level: Not on file  Occupational History   Not on file  Tobacco Use   Smoking status: Every Day    Packs/day: 1.00    Years: 30.00    Pack years: 30.00    Types: Cigarettes   Smokeless tobacco: Never  Substance and Sexual Activity   Alcohol use: Yes    Alcohol/week: 6.0 standard drinks    Types: 6 Cans of beer per week    Comment: 6 pack per day   Drug use: Yes    Frequency: 2.0 times per week    Types: Marijuana, Cocaine   Sexual activity: Yes  Other Topics Concern   Not on file  Social History Narrative   Not on file   Social Determinants of Health   Financial Resource Strain: Not on file  Food Insecurity: Not on file  Transportation Needs: Not on file  Physical Activity: Not on file  Stress: Not on file  Social Connections: Not on file  Intimate Partner Violence: Not on file     Review of Systems: General: negative for chills, fever, night sweats or weight changes.  Cardiovascular: negative for chest pain, dyspnea on exertion, edema, orthopnea, palpitations, paroxysmal nocturnal  dyspnea or shortness of breath Dermatological: negative for rash Respiratory: negative for cough or wheezing Urologic: negative for hematuria Abdominal: negative for nausea, vomiting, diarrhea, bright red blood per rectum, melena, or hematemesis Neurologic: negative for visual changes, syncope, or dizziness All other systems reviewed and are otherwise negative except as noted above.    Blood pressure 133/78, pulse 75, height 5\' 10"  (1.778 m), weight 179 lb 12.8 oz (81.6 kg), SpO2 97 %.  General appearance: alert and no distress Neck: no adenopathy, no carotid bruit, no JVD, supple, symmetrical, trachea midline, and thyroid not enlarged, symmetric, no tenderness/mass/nodules Lungs: clear to auscultation bilaterally Heart: regular rate and rhythm, S1, S2 normal, no murmur, click, rub  or gallop Extremities: extremities normal, atraumatic, no cyanosis or edema Pulses: 2+ and symmetric Skin: Skin color, texture, turgor normal. No rashes or lesions Neurologic: Grossly normal  EKG sinus rhythm at 75 with left axis deviation, small inferior Q waves.  Personally reviewed this EKG.  ASSESSMENT AND PLAN:   History of ST elevation myocardial infarction (STEMI) History of anterior STEMI in the setting of cocaine use.  I took him to the Cath Lab 07/10/2017 revealing occluded distal LAD which I stented using a Synergy drug-eluting stent postdilated to 2.75 mm.  He did have an ostial 95% OM1 lesion, 70% proximal dominant RCA stenosis with preserved LV function.  He had an apical wall motion abnormality.  2D echo performed 07/10/2017 revealed an EF in the 45 to 50% range.  He remains on aspirin.  He denies chest pain or shortness of breath.  Ischemic cardiomyopathy History of ischemic cardiomyopathy with an EF in the 45 to 50% range on a beta-blocker and ACE inhibitor.  He has no symptoms of heart failure.  Essential hypertension History of essential hypertension a blood pressure measured today at 133/78.  He is on metoprolol and lisinopril.  Dyslipidemia History of dyslipidemia newly on Praluent although he never got this because of cost.  He is not on a statin drug either.  We will check a fasting lipid liver profile and determine whether or not he needs statin therapy for secondary prevention.  Tobacco abuse Ongoing tobacco abuse of 1/2 pack/day.      07/12/2017 MD FACP,FACC,FAHA, [facility] 04/17/2022 2:34 PM

## 2022-04-17 NOTE — Assessment & Plan Note (Signed)
History of dyslipidemia newly on Praluent although he never got this because of cost.  He is not on a statin drug either.  We will check a fasting lipid liver profile and determine whether or not he needs statin therapy for secondary prevention.

## 2022-04-17 NOTE — Assessment & Plan Note (Signed)
History of ischemic cardiomyopathy with an EF in the 45 to 50% range on a beta-blocker and ACE inhibitor.  He has no symptoms of heart failure.

## 2022-04-17 NOTE — Assessment & Plan Note (Signed)
Ongoing tobacco abuse of 1/2 pack/day.

## 2022-04-17 NOTE — Patient Instructions (Signed)
Medication Instructions:  Your physician recommends that you continue on your current medications as directed. Please refer to the Current Medication list given to you today.  *If you need a refill on your cardiac medications before your next appointment, please call your pharmacy*   Lab Work: Your physician recommends that you return for lab work in: the next week or 2 for FASTING lipid/liver profile  If you have labs (blood work) drawn today and your tests are completely normal, you will receive your results only by: MyChart Message (if you have MyChart) OR A paper copy in the mail If you have any lab test that is abnormal or we need to change your treatment, we will call you to review the results.   Follow-Up: At [facility], you and your health needs are our priority.  As part of our continuing mission to provide you with exceptional heart care, we have created designated Provider Care Teams.  These Care Teams include your primary Cardiologist (physician) and Advanced Practice Providers (APPs -  Physician Assistants and Nurse Practitioners) who all work together to provide you with the care you need, when you need it.  We recommend signing up for the patient portal called "MyChart".  Sign up information is provided on this After Visit Summary.  MyChart is used to connect with patients for Virtual Visits (Telemedicine).  Patients are able to view lab/test results, encounter notes, upcoming appointments, etc.  Non-urgent messages can be sent to your provider as well.   To learn more about what you can do with MyChart, go to [email or web address].    Your next appointment:   12 month(s)  The format for your next appointment:   In Person  Provider:   Jonathan Berry, MD  

## 2022-04-17 NOTE — Assessment & Plan Note (Signed)
History of essential hypertension a blood pressure measured today at 133/78.  He is on metoprolol and lisinopril.

## 2022-04-17 NOTE — Assessment & Plan Note (Signed)
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  I took him to the Cath Lab 07/10/2017 revealing occluded distal LAD which I stented using a Synergy drug-eluting stent postdilated to 2.75 mm.  He did have an ostial 95% OM1 lesion, 70% proximal dominant RCA stenosis with preserved LV function.  He had an apical wall motion abnormality.  2D echo performed 07/10/2017 revealed an EF in the 45 to 50% range.  He remains on aspirin.  He denies chest pain or shortness of breath.

## 2022-05-09 ENCOUNTER — Telehealth: Payer: Self-pay | Admitting: Licensed Clinical Social Worker

## 2022-05-09 NOTE — Telephone Encounter (Signed)
LCSW attempted to reach pt this morning at [phone], phone rang immediately to voicemail. I left voicemail requesting call back. No listed insurance and no PCP- plan to assess for SDOH needs.   Octavio Graves, MSW, LCSW Clinical Social Worker II [facility] Navigation  [phone]- work cell phone (preferred) [phone]- desk phone

## 2022-05-16 ENCOUNTER — Telehealth: Payer: Self-pay | Admitting: Licensed Clinical Social Worker

## 2022-05-16 NOTE — Telephone Encounter (Signed)
LCSW attempted to reach pt this morning at [phone], phone rang immediately to voicemail. I left 2nd voicemail requesting call back. No listed insurance and no PCP- plan to assess for SDOH needs.    Octavio Graves, MSW, LCSW Clinical Social Worker II [facility] Navigation  [phone]- work cell phone (preferred) [phone]- desk phone

## 2022-05-21 ENCOUNTER — Telehealth: Payer: Self-pay | Admitting: Licensed Clinical Social Worker

## 2022-05-21 NOTE — Telephone Encounter (Signed)
LCSW attempted for a third time to reach pt. No answer, at this time voicemail is full.  I will mail [employer] and [employer] applications along with my card. Remain available should pt wish to engage in Care Navigation resources.  Octavio Graves, MSW, LCSW Clinical Social Worker II [facility] Navigation  [phone]- work cell phone (preferred) [phone]- desk phone
